# Patient Record
Sex: Male | Born: 1960 | Race: Black or African American | Hispanic: No | Marital: Married | Smoking: Never smoker
Health system: Southern US, Community
[De-identification: ages and names within clinical notes are randomized; demographics above are authoritative.]

## PROBLEM LIST (undated history)

## (undated) DIAGNOSIS — K5780 Diverticulitis of intestine, part unspecified, with perforation and abscess without bleeding: Secondary | ICD-10-CM

---

## 2008-01-07 HISTORY — PX: INCISION AND DRAINAGE ABSCESS: SHX5864

## 2008-07-12 ENCOUNTER — Ambulatory Visit (HOSPITAL_COMMUNITY): Admission: RE | Admit: 2008-07-12 | Discharge: 2008-07-12 | Payer: Self-pay | Admitting: General Surgery

## 2010-04-14 LAB — CULTURE, ROUTINE-ABSCESS

## 2010-04-14 LAB — ANAEROBIC CULTURE

## 2013-04-27 ENCOUNTER — Inpatient Hospital Stay (HOSPITAL_COMMUNITY)
Admission: AD | Admit: 2013-04-27 | Discharge: 2013-05-01 | DRG: 392 | Disposition: A | Discharge status: Home / Self Care | Payer: BC Managed Care – PPO | Source: Other Acute Inpatient Hospital | Attending: Internal Medicine | Admitting: Internal Medicine

## 2013-04-27 ENCOUNTER — Encounter (HOSPITAL_COMMUNITY): Payer: Self-pay | Admitting: Internal Medicine

## 2013-04-27 DIAGNOSIS — K5720 Diverticulitis of large intestine with perforation and abscess without bleeding: Secondary | ICD-10-CM | POA: Diagnosis present

## 2013-04-27 DIAGNOSIS — R110 Nausea: Secondary | ICD-10-CM | POA: Diagnosis not present

## 2013-04-27 DIAGNOSIS — K5732 Diverticulitis of large intestine without perforation or abscess without bleeding: Principal | ICD-10-CM

## 2013-04-27 DIAGNOSIS — K630 Abscess of intestine: Secondary | ICD-10-CM | POA: Diagnosis present

## 2013-04-27 DIAGNOSIS — D72829 Elevated white blood cell count, unspecified: Secondary | ICD-10-CM

## 2013-04-27 HISTORY — DX: Diverticulitis of intestine, part unspecified, with perforation and abscess without bleeding: K57.80

## 2013-04-27 LAB — CBC WITH DIFFERENTIAL/PLATELET
BASOS ABS: 0 10*3/uL (ref 0.0–0.1)
BASOS PCT: 0 % (ref 0–1)
EOS ABS: 0.1 10*3/uL (ref 0.0–0.7)
EOS PCT: 1 % (ref 0–5)
HEMATOCRIT: 41.2 % (ref 39.0–52.0)
Hemoglobin: 14.3 g/dL (ref 13.0–17.0)
LYMPHS PCT: 22 % (ref 12–46)
Lymphs Abs: 2 10*3/uL (ref 0.7–4.0)
MCH: 30.9 pg (ref 26.0–34.0)
MCHC: 34.7 g/dL (ref 30.0–36.0)
MCV: 89 fL (ref 78.0–100.0)
MONO ABS: 1.2 10*3/uL — AB (ref 0.1–1.0)
Monocytes Relative: 14 % — ABNORMAL HIGH (ref 3–12)
Neutro Abs: 5.8 10*3/uL (ref 1.7–7.7)
Neutrophils Relative %: 63 % (ref 43–77)
Platelets: 382 10*3/uL (ref 150–400)
RBC: 4.63 MIL/uL (ref 4.22–5.81)
RDW: 13.2 % (ref 11.5–15.5)
WBC: 9.1 10*3/uL (ref 4.0–10.5)

## 2013-04-27 LAB — PROTIME-INR
INR: 1.11 (ref 0.00–1.49)
PROTHROMBIN TIME: 14.1 s (ref 11.6–15.2)

## 2013-04-27 LAB — BASIC METABOLIC PANEL
BUN: 13 mg/dL (ref 6–23)
CALCIUM: 9.5 mg/dL (ref 8.4–10.5)
CO2: 25 meq/L (ref 19–32)
CREATININE: 1.21 mg/dL (ref 0.50–1.35)
Chloride: 98 mEq/L (ref 96–112)
GFR calc Af Amer: 77 mL/min — ABNORMAL LOW (ref >90)
GFR, EST NON AFRICAN AMERICAN: 67 mL/min — AB (ref >90)
Glucose, Bld: 130 mg/dL — ABNORMAL HIGH (ref 70–99)
Potassium: 4.3 mEq/L (ref 3.7–5.3)
Sodium: 137 mEq/L (ref 137–147)

## 2013-04-27 MED ORDER — HYDROMORPHONE HCL PF 1 MG/ML IJ SOLN
1.0000 mg | INTRAMUSCULAR | Status: DC | PRN
  Administered 2013-04-27 – 2013-04-28 (×6): 1 mg via INTRAVENOUS
  Filled 2013-04-27 (×6): qty 1

## 2013-04-27 MED ORDER — ACETAMINOPHEN 650 MG RE SUPP: 650.0000 mg | Freq: Four times a day (QID) | RECTAL | Status: DC | PRN

## 2013-04-27 MED ORDER — SODIUM CHLORIDE 0.9 % IV SOLN
3.0000 g | Freq: Four times a day (QID) | INTRAVENOUS | Status: DC
  Administered 2013-04-27 – 2013-04-29 (×8): 3 g via INTRAVENOUS
  Filled 2013-04-27 (×11): qty 3

## 2013-04-27 MED ORDER — ACETAMINOPHEN 325 MG PO TABS: 650.0000 mg | ORAL_TABLET | Freq: Four times a day (QID) | ORAL | Status: DC | PRN

## 2013-04-27 MED ORDER — ALUM & MAG HYDROXIDE-SIMETH 200-200-20 MG/5ML PO SUSP: 30.0000 mL | Freq: Four times a day (QID) | ORAL | Status: DC | PRN

## 2013-04-27 MED ORDER — ONDANSETRON HCL 4 MG PO TABS
4.0000 mg | ORAL_TABLET | Freq: Four times a day (QID) | ORAL | Status: DC | PRN
  Administered 2013-04-29: 4 mg via ORAL
  Filled 2013-04-27: qty 1

## 2013-04-27 MED ORDER — SODIUM CHLORIDE 0.9 % IV SOLN
INTRAVENOUS | Status: DC
  Administered 2013-04-27: 21:00:00 via INTRAVENOUS
  Administered 2013-04-28: 100 mL/h via INTRAVENOUS
  Administered 2013-04-28: 10:00:00 via INTRAVENOUS

## 2013-04-27 MED ORDER — ONDANSETRON HCL 4 MG/2ML IJ SOLN: 4.0000 mg | Freq: Four times a day (QID) | INTRAMUSCULAR | Status: DC | PRN

## 2013-04-27 MED ORDER — OXYCODONE HCL 5 MG PO TABS
5.0000 mg | ORAL_TABLET | ORAL | Status: DC | PRN
  Administered 2013-04-28 – 2013-05-01 (×8): 5 mg via ORAL
  Filled 2013-04-27 (×8): qty 1

## 2013-04-27 NOTE — Progress Notes (Signed)
[facility] History and Physical  Bobby StackWilliam [name] WUJ:[id number] DOB: 02-20-1960 DOA: 04/27/2013  Referring physician: Sherryll [name] PCP: Bobby [name]   Chief Complaint: abdominal pain  HPI: Bobby [name] is a 53 y.o. male  With [facility] to [facility] last week for diverticulitis. Presented to EDP today with continued pain, fevers, occasional nausea. [place] showed [place].  Directly admitted for IV antibiotics, and consideration of percutanious drainage.  Pt able to eat and drink, but pain unrelieved by vicodin. Still on cipro/flagyl.  Also had diverticular abscess in 2010 requiring percutaneous drainage.  Had colonoscopy within the past 5 years in [place]. Reportedly ok.  Review of Systems:  Charts reviewed. As above, otherwise negative  Past Medical History  Diagnosis Date  . Diverticulitis of intestine with abscess    No past surgical history on file. Social History:  has no tobacco, alcohol, and drug history on file.works as truckdriver  Allergies not on file  Family History  Problem Relation Age of Onset  . Diabetes Mother   . Hypertension Mother      Prior to Admission medications   Not on File   Physical Exam: Filed Vitals:   04/27/13 1754  BP: 96/63  Pulse: 97  Temp: 98.2 F (36.8 C)  Resp: 16    BP 96/63  Pulse 97  Temp(Src) 98.2 F (36.8 C) (Oral)  Resp 16  Ht 5\' 6"  (1.676 m)  Wt 79.833 kg (176 lb)  BMI 28.42 kg/m2  SpO2 98%       BP 94/60  Pulse 74  Temp(Src) 97.8 F (36.6 C) (Oral)  Resp 16  Ht 5\' 6"  (1.676 m)  Wt 79.833 kg (176 lb)  BMI 28.42 kg/m2  SpO2 99%  General Appearance:    Alert, cooperative, no distress, appears stated age, nontoxic  Head:    Normocephalic, without obvious abnormality, atraumatic  Eyes:    PERRL, conjunctiva/corneas clear, EOM's intact, fundi    benign, both eyes          Nose:   Nares normal, septum midline, mucosa normal, no drainage   or sinus tenderness    Throat:   Lips, mucosa, and tongue normal; teeth and gums normal  Neck:   Supple, symmetrical, trachea midline, no adenopathy;       thyroid:  No enlargement/tenderness/nodules; no carotid   bruit or JVD  Back:     Symmetric, no curvature, ROM normal, no CVA tenderness  Lungs:     Clear to auscultation bilaterally, respirations unlabored  Chest wall:    No tenderness or deformity  Heart:    Regular rate and rhythm, S1 and S2 normal, no murmur, rub   or gallop  Abdomen:     Soft, mild lower abd tenderness, nondistended. Bowel sounds present  Genitalia:  deferred  Rectal:  deferred  Extremities:   Extremities normal, atraumatic, no cyanosis or edema  Pulses:   2+ and symmetric all extremities  Skin:   Skin color, texture, turgor normal, no rashes or lesions  Lymph nodes:   Cervical, supraclavicular, and axillary nodes normal  Neurologic:   CNII-XII intact. Normal strength, sensation and reflexes      throughout   Labs pending  Assessment/Plan Active Problems:   Diverticulitis of large intestine with abscess without bleeding discussed with Dr. Bonnielee HaffHoss, IR.  Amenable to perc drainage. Will start clears, unasyn. No heparin products before procedure. Pain and nausea meds. F/u CBC, BMET.    Code Status: *full Family Communication: *at  bedside Disposition Plan: home  Time spent: 45 minutes  Christiane Haorinna L Cutter [name] [facility] Pager [phone]

## 2013-04-28 ENCOUNTER — Inpatient Hospital Stay (HOSPITAL_COMMUNITY): Payer: BC Managed Care – PPO

## 2013-04-28 ENCOUNTER — Encounter (HOSPITAL_COMMUNITY): Payer: Self-pay | Admitting: Radiology

## 2013-04-28 MED ORDER — HYDROMORPHONE HCL PF 1 MG/ML IJ SOLN
1.0000 mg | INTRAMUSCULAR | Status: DC | PRN
  Administered 2013-04-28: 1 mg via INTRAVENOUS
  Administered 2013-04-28 – 2013-04-29 (×2): 2 mg via INTRAVENOUS
  Administered 2013-04-29: 1 mg via INTRAVENOUS
  Administered 2013-04-29: 2 mg via INTRAVENOUS
  Filled 2013-04-28: qty 1
  Filled 2013-04-28 (×3): qty 2
  Filled 2013-04-28: qty 1

## 2013-04-28 MED ORDER — MIDAZOLAM HCL 2 MG/2ML IJ SOLN
INTRAMUSCULAR | Status: AC
  Filled 2013-04-28: qty 6

## 2013-04-28 MED ORDER — LIDOCAINE HCL 1 % IJ SOLN
INTRAMUSCULAR | Status: AC
  Filled 2013-04-28: qty 10

## 2013-04-28 MED ORDER — FENTANYL CITRATE 0.05 MG/ML IJ SOLN
INTRAMUSCULAR | Status: AC | PRN
  Administered 2013-04-28 (×3): 50 ug via INTRAVENOUS

## 2013-04-28 MED ORDER — HYDROCODONE-ACETAMINOPHEN 5-325 MG PO TABS
1.0000 | ORAL_TABLET | ORAL | Status: DC | PRN
  Administered 2013-04-28 – 2013-04-30 (×4): 2 via ORAL
  Filled 2013-04-28 (×4): qty 2

## 2013-04-28 MED ORDER — FENTANYL CITRATE 0.05 MG/ML IJ SOLN
INTRAMUSCULAR | Status: AC
  Filled 2013-04-28: qty 4

## 2013-04-28 MED ORDER — MIDAZOLAM HCL 2 MG/2ML IJ SOLN
INTRAMUSCULAR | Status: AC | PRN
  Administered 2013-04-28: 1 mg via INTRAVENOUS
  Administered 2013-04-28 (×2): 2 mg via INTRAVENOUS

## 2013-04-28 NOTE — Progress Notes (Signed)
  PROGRESS NOTE  Bobby StackWilliam [name] SWF:[id number] DOB: Sep 05, 1960 DOA: 04/27/2013 PCP: Bobby [name]  Assessment/Plan: Diverticulitis of large intestine with abscess without bleeding  UK:GURKR:perc drainage.  -unasyn. -advance diet after procedure -Pain and nausea meds. -F/u CBC, BMET   Code Status: full Family Communication: family at bedside Disposition Plan: home 1-2 days   Consultants:  IR  Procedures:     HPI/Subjective:  abd pain about the same Asking to eat  Objective: Filed Vitals:   04/28/13 1003  BP: 96/68  Pulse: 78  Temp: 98.4 F (36.9 C)  Resp: 16    Intake/Output Summary (Last 24 hours) at 04/28/13 1007 Last data filed at 04/28/13 1006  Gross per 24 hour  Intake    240 ml  Output    500 ml  Net   -260 ml   Filed Weights   04/27/13 1754  Weight: 79.833 kg (176 lb)    Exam:   General:  A+Ox3, NAD- up walking in room  Cardiovascular: rrr  Respiratory: clear anterior  Abdomen: +BS, soft, min LLQ tenderness  Musculoskeletal: no edema   Data Reviewed: Basic Metabolic Panel:  Recent Labs Lab 04/27/13 1845  NA 137  K 4.3  CL 98  CO2 25  GLUCOSE 130*  BUN 13  CREATININE 1.21  CALCIUM 9.5   Liver Function Tests: No results found for this basename: AST, ALT, ALKPHOS, BILITOT, PROT, ALBUMIN,  in the last 168 hours No results found for this basename: LIPASE, AMYLASE,  in the last 168 hours No results found for this basename: AMMONIA,  in the last 168 hours CBC:  Recent Labs Lab 04/27/13 1845  WBC 9.1  NEUTROABS 5.8  HGB 14.3  HCT 41.2  MCV 89.0  PLT 382   Cardiac Enzymes: No results found for this basename: CKTOTAL, CKMB, CKMBINDEX, TROPONINI,  in the last 168 hours BNP (last 3 results) No results found for this basename: PROBNP,  in the last 8760 hours CBG: No results found for this basename: GLUCAP,  in the last 168 hours  No results found for this or any previous visit (from the past 240 hour(s)).   Studies: No  results found.  Scheduled Meds: . ampicillin-sulbactam (UNASYN) IV  3 g Intravenous Q6H   Continuous Infusions: . sodium chloride 100 mL/hr at 04/27/13 2055   Antibiotics Given (last 72 hours)   Date/Time Action Medication Dose Rate   04/27/13 2056 Given   Ampicillin-Sulbactam (UNASYN) 3 g in sodium chloride 0.9 % 100 mL IVPB 3 g 100 mL/hr   04/28/13 0150 Given   Ampicillin-Sulbactam (UNASYN) 3 g in sodium chloride 0.9 % 100 mL IVPB 3 g 100 mL/hr   04/28/13 0657 Given   Ampicillin-Sulbactam (UNASYN) 3 g in sodium chloride 0.9 % 100 mL IVPB 3 g 100 mL/hr      Active Problems:   Diverticulitis of large intestine with abscess without bleeding    Time spent: 35 min    Bobby [name]  [facility] Pager [phone]. If 7PM-7AM, please contact night-coverage at [email or web address], password [email or web address] 04/28/2013, 10:07 AM  LOS: 1 day

## 2013-04-28 NOTE — Procedures (Signed)
CT guided pelvic 49F abscess drain catheter placement 20ml purulent out, sample for GS, C&S No complication No blood loss. See complete dictation in [facility].

## 2013-04-28 NOTE — H&P (Signed)
Chief Complaint: "Abdominal pain." Referring Physician: Dr. Conley Canal HPI: Bobby [name] is an 53 y.o. male who presented complaining of LLQ abdominal pain x 1 week, fever and chills. He had a CT in [place] which revealed diverticulitis perforation with abscess, he was placed on oral antibiotics and sent home. He presented back to the hospital since pain was worsening, repeat CT revealed increasing abscess. He has been placed on IV antibiotics and IR received request for image guided diverticular abscess drainage. He rates his abdominal LLQ pain 8/10, however pain is currently controlled with pain medications. He denies any chest pain, shortness of breath or palpitations. He denies any active signs of bleeding or excessive bruising. The patient denies any history of sleep apnea or chronic oxygen use. He has previously tolerated sedation without complications during a colonoscopy.   Past Medical History:  Past Medical History  Diagnosis Date  . Diverticulitis of intestine with abscess     Past Surgical History:  Past Surgical History  Procedure Laterality Date  . Incision and drainage abscess  2010    "diverticulitis"    Family History:  Family History  Problem Relation Age of Onset  . Diabetes Mother   . Hypertension Mother     Social History:  reports that he has never smoked. He has never used smokeless tobacco. He reports that he does not drink alcohol or use illicit drugs.  Allergies: Not on File  Medications:   Medication List    ASK your doctor about these medications       ciprofloxacin 500 MG tablet  Commonly known as:  CIPRO  Take 500 mg by mouth 2 (two) times daily. For 10 days     HYDROcodone-acetaminophen 5-325 MG per tablet  Commonly known as:  NORCO/VICODIN  Take 1 tablet by mouth every 6 (six) hours as needed for moderate pain.     metroNIDAZOLE 500 MG tablet  Commonly known as:  FLAGYL  Take 500 mg by mouth 3 (three) times daily. For 10 days       Please  HPI for pertinent positives, otherwise complete 10 system ROS negative.  Physical Exam: BP 99/59  Pulse 79  Temp(Src) 99.6 F (37.6 C) (Oral)  Resp 17  Ht 5' 6"  (1.676 m)  Wt 176 lb (79.833 kg)  BMI 28.42 kg/m2  SpO2 99% Body mass index is 28.42 kg/(m^2).  General Appearance:  Alert, cooperative, no distress  Head:  Normocephalic, without obvious abnormality, atraumatic  Neck: Supple, symmetrical, trachea midline  Lungs:   Clear to auscultation bilaterally, no w/r/r, respirations unlabored without use of accessory muscles.  Chest Wall:  No tenderness or deformity  Heart:  Regular rate and rhythm, S1, S2 normal, no murmur, rub or gallop.  Abdomen:   Soft, LLQ tenderness, non distended, (+) BS  Extremities: Extremities normal, atraumatic, no cyanosis or edema  Pulses: 2+ and symmetric  Neurologic: Normal affect, no gross deficits.   Results for orders placed during the hospital encounter of 04/27/13 (from the past 48 hour(s))  PROTIME-INR     Status: None   Collection Time    04/27/13  6:44 PM      Result Value Ref Range   Prothrombin Time 14.1  11.6 - 15.2 seconds   INR 1.11  0.00 - 1.49  CBC WITH DIFFERENTIAL     Status: Abnormal   Collection Time    04/27/13  6:45 PM      Result Value Ref Range   WBC 9.1  4.0 -  10.5 K/uL   RBC 4.63  4.22 - 5.81 MIL/uL   Hemoglobin 14.3  13.0 - 17.0 g/dL   HCT 41.2  39.0 - 52.0 %   MCV 89.0  78.0 - 100.0 fL   MCH 30.9  26.0 - 34.0 pg   MCHC 34.7  30.0 - 36.0 g/dL   RDW 13.2  11.5 - 15.5 %   Platelets 382  150 - 400 K/uL   Neutrophils Relative % 63  43 - 77 %   Neutro Abs 5.8  1.7 - 7.7 K/uL   Lymphocytes Relative 22  12 - 46 %   Lymphs Abs 2.0  0.7 - 4.0 K/uL   Monocytes Relative 14 (*) 3 - 12 %   Monocytes Absolute 1.2 (*) 0.1 - 1.0 K/uL   Eosinophils Relative 1  0 - 5 %   Eosinophils Absolute 0.1  0.0 - 0.7 K/uL   Basophils Relative 0  0 - 1 %   Basophils Absolute 0.0  0.0 - 0.1 K/uL  BASIC METABOLIC PANEL     Status: Abnormal    Collection Time    04/27/13  6:45 PM      Result Value Ref Range   Sodium 137  137 - 147 mEq/L   Potassium 4.3  3.7 - 5.3 mEq/L   Chloride 98  96 - 112 mEq/L   CO2 25  19 - 32 mEq/L   Glucose, Bld 130 (*) 70 - 99 mg/dL   BUN 13  6 - 23 mg/dL   Creatinine, Ser 1.21  0.50 - 1.35 mg/dL   Calcium 9.5  8.4 - 10.5 mg/dL   GFR calc non Af Amer 67 (*) >90 mL/min   GFR calc Af Amer 77 (*) >90 mL/min   Comment: (NOTE)     The eGFR has been calculated using the CKD EPI equation.     This calculation has not been validated in all clinical situations.     eGFR's persistently <90 mL/min signify possible Chronic Kidney     Disease.   No results found.  Assessment/Plan Abdominal pain.  Fever. Diverticulitis with perforation and abscess Request for image guided diverticular abscess drainage Patient has been NPO, on IV antibiotics, labs and images reviewed. Risks and Benefits discussed with the patient. All of the patient's questions were answered, patient is agreeable to proceed. Consent signed and in chart.   Hedy Jacob PA-C 04/28/2013, 8:52 AM

## 2013-04-29 DIAGNOSIS — K5732 Diverticulitis of large intestine without perforation or abscess without bleeding: Secondary | ICD-10-CM

## 2013-04-29 DIAGNOSIS — D72829 Elevated white blood cell count, unspecified: Secondary | ICD-10-CM

## 2013-04-29 LAB — CBC
HCT: 38.8 % — ABNORMAL LOW (ref 39.0–52.0)
Hemoglobin: 12.7 g/dL — ABNORMAL LOW (ref 13.0–17.0)
MCH: 29.7 pg (ref 26.0–34.0)
MCHC: 32.7 g/dL (ref 30.0–36.0)
MCV: 90.7 fL (ref 78.0–100.0)
PLATELETS: 375 10*3/uL (ref 150–400)
RBC: 4.28 MIL/uL (ref 4.22–5.81)
RDW: 13.4 % (ref 11.5–15.5)
WBC: 11.4 10*3/uL — AB (ref 4.0–10.5)

## 2013-04-29 LAB — BASIC METABOLIC PANEL
BUN: 10 mg/dL (ref 6–23)
CHLORIDE: 98 meq/L (ref 96–112)
CO2: 23 mEq/L (ref 19–32)
Calcium: 8.8 mg/dL (ref 8.4–10.5)
Creatinine, Ser: 1.19 mg/dL (ref 0.50–1.35)
GFR calc Af Amer: 79 mL/min — ABNORMAL LOW (ref >90)
GFR calc non Af Amer: 68 mL/min — ABNORMAL LOW (ref >90)
Glucose, Bld: 119 mg/dL — ABNORMAL HIGH (ref 70–99)
POTASSIUM: 4.1 meq/L (ref 3.7–5.3)
Sodium: 135 mEq/L — ABNORMAL LOW (ref 137–147)

## 2013-04-29 MED ORDER — PIPERACILLIN-TAZOBACTAM 3.375 G IVPB
3.3750 g | Freq: Three times a day (TID) | INTRAVENOUS | Status: DC
  Administered 2013-04-29 – 2013-05-01 (×6): 3.375 g via INTRAVENOUS
  Filled 2013-04-29 (×10): qty 50

## 2013-04-29 MED ORDER — POLYETHYLENE GLYCOL 3350 17 G PO PACK
17.0000 g | PACK | Freq: Every day | ORAL | Status: DC
  Administered 2013-04-29 – 2013-05-01 (×3): 17 g via ORAL
  Filled 2013-04-29 (×3): qty 1

## 2013-04-29 NOTE — Progress Notes (Signed)
ANTIBIOTIC CONSULT NOTE - INITIAL  Pharmacy Consult for Zosyn Indication: diverticulitis with intra-ab abcess  No Known Allergies  Patient Measurements: Height: 5\' 6"  (167.6 cm) Weight: 176 lb (79.833 kg) IBW/kg (Calculated) : 63.8   Vital Signs: Temp: 98 F (36.7 C) (04/24 1417) Temp src: Oral (04/24 1417) BP: 102/69 mmHg (04/24 1417) Pulse Rate: 71 (04/24 1417) Intake/Output from previous day: 04/23 0701 - 04/24 0700 In: 1888.3 [P.O.:240; I.V.:1648.3] Out: 1965 [Urine:1925; Drains:40] Intake/Output from this shift: Total I/O In: 360 [P.O.:360] Out: -   Labs:  Recent Labs  04/27/13 1845 04/29/13 0339  WBC 9.1 11.4*  HGB 14.3 12.7*  PLT 382 375  CREATININE 1.21 1.19   Estimated Creatinine Clearance: 71.3 ml/min (by C-G formula based on Cr of 1.19). No results found for this basename: VANCOTROUGH, Leodis BinetVANCOPEAK, VANCORANDOM, GENTTROUGH, GENTPEAK, GENTRANDOM, TOBRATROUGH, TOBRAPEAK, TOBRARND, AMIKACINPEAK, AMIKACINTROU, AMIKACIN,  in the last 72 hours   Microbiology: Recent Results (from the past 720 hour(s))  CULTURE, ROUTINE-ABSCESS     Status: None   Collection Time    04/28/13  1:45 PM      Result Value Ref Range Status   Specimen Description ABSCESS LEFT ABDOMEN   Final   Special Requests Normal   Final   Gram Stain     Final   Value: ABUNDANT WBC PRESENT,BOTH PMN AND MONONUCLEAR     NO SQUAMOUS EPITHELIAL CELLS SEEN     MODERATE GRAM POSITIVE COCCI     IN PAIRS FEW GRAM POSITIVE RODS     FEW GRAM NEGATIVE RODS   Culture     Final   Value: Culture reincubated for better growth     Performed at [facility]   Report Status PENDING   Incomplete    Medical History: Past Medical History  Diagnosis Date  . Diverticulitis of intestine with abscess      Assessment: 52yom admitted with LLQ pain found to have diverticulitis and intra-abdominal abcess.  WBC 11, Cr 1.2 Crcl 7670ml/min.  He was originally started on Unasyn but changing to Zosyn to  broaden coverage.   Since renal function stable - Pharmacy to sign off.     Plan:  Zosyn 3.375 GM IV q8hr EI  Leota SauersLisa Deaisa Merida Pharm.D. CPP, BCPS Clinical Pharmacist [phone] 04/29/2013 3:44 PM

## 2013-04-29 NOTE — Progress Notes (Signed)
Subjective: He states his abdominal pain has improved but still with soreness now around drain site. He admits to start of nausea today.  Objective: Physical Exam: BP 102/69  Pulse 71  Temp(Src) 98 F (36.7 C) (Oral)  Resp 18  Ht 5\' 6"  (1.676 m)  Wt 176 lb (79.833 kg)  BMI 28.42 kg/m2  SpO2 96%  General: A&Ox3, NAD Abd: Soft, LLQ drain intact bloody thick output, tenderness at site, Cx MODERATE GRAM POSITIVE COCCI IN PAIRS FEW GRAM POSITIVE RODS FEW GRAM NEGATIVE RODS  Labs: CBC  Recent Labs  04/27/13 1845 04/29/13 0339  WBC 9.1 11.4*  HGB 14.3 12.7*  HCT 41.2 38.8*  PLT 382 375   BMET  Recent Labs  04/27/13 1845 04/29/13 0339  NA 137 135*  K 4.3 4.1  CL 98 98  CO2 25 23  GLUCOSE 130* 119*  BUN 13 10  CREATININE 1.21 1.19  CALCIUM 9.5 8.8   LFT No results found for this basename: PROT, ALBUMIN, AST, ALT, ALKPHOS, BILITOT, BILIDIR, IBILI, LIPASE,  in the last 72 hours PT/INR  Recent Labs  04/27/13 1844  LABPROT 14.1  INR 1.11     Studies/Results: Ct Image Guided Drainage By Percutaneous Catheter  04/28/2013   CLINICAL DATA:  Recurrent diverticular abscess.  EXAM: CT GUIDED DRAINAGE OF PELVIC ABSCESS  ANESTHESIA/SEDATION: Intravenous Fentanyl and Versed were administered as conscious sedation during continuous cardiorespiratory monitoring by the radiology RN, with a total moderate sedation time of 10 minutes.  PROCEDURE: The procedure, risks, benefits, and alternatives were explained to the patient. Questions regarding the procedure were encouraged and answered. The patient understands and consents to the procedure.  Select axial scans through the lower pelvis were obtained an the dominant collection was localized. An appropriate skin entry site was determined.  The overlying skin was prepped with Betadinein a sterile fashion, and a sterile drape was applied covering the operative field. A sterile gown and sterile gloves were used for the procedure. Local  anesthesia was provided with 1% Lidocaine.  Under CT fluoroscopic guidance, a 19 gauge percutaneous entry needle was advanced into the collection. Purulent fluid spontaneously returned through the needle hub. A guidewire advanced easily through the needle, its position confirmed on CT. The tract was dilated to facilitate placement of 12 French pigtail catheter, formed centrally within the collection. Catheter placement confirmed on CT. The catheter was secured externally with 0 Prolene suture and StatLock and placed to gravity bag. A total of approximately 20 mL purulent material were aspirated. A sample was sent for routine Gram stain, culture and sensitivity. The patient tolerated the procedure well.  COMPLICATIONS: No immediate  FINDINGS: The loculated pelvic gas and fluid collection immediately cephalad to the urinary bladder was again identified. Percutaneous drain catheter placed under CT guidance.  IMPRESSION: 1. Technically successful CT-guided pelvic abscess drain catheter placement.   Electronically Signed   By: Oley Balmaniel  Hassell M.D.   On: 04/28/2013 14:08    Assessment/Plan: Diverticulitis with perforation and abscess S/p 76F perc drain placed 04/28/13, output bloody purulent, wbc increased, afebrile, on Zosyn Appreciate TRH and CCS input.  IR will follow.    LOS: 2 days    Berneta LevinsKoreen D Eligh Rybacki PA-C 04/29/2013 3:24 PM

## 2013-04-29 NOTE — Progress Notes (Addendum)
PROGRESS NOTE  Bobbie StackWilliam Mau BJY:[id number] DOB: 17-Oct-1960 DOA: 04/27/2013 PCP: Kirstie PeriSHAH,ASHISH, MD  Assessment/Plan: Diverticulitis of large intestine with abscess without bleeding  YQ:MVHQR:perc drainage. Plan per IR -unasyn. -Pain and nausea meds.- trying to just use PO pain meds -ask surgery to consult  Leukocytosis- -monitor   Code Status: full Family Communication: family at bedside Disposition Plan: home 1-2 days   Consultants:  IR  Procedures:     HPI/Subjective:  No complaints   Objective: Filed Vitals:   04/29/13 1023  BP: 105/69  Pulse: 88  Temp: 98.1 F (36.7 C)  Resp: 18    Intake/Output Summary (Last 24 hours) at 04/29/13 1037 Last data filed at 04/29/13 [id number]  Gross per 24 hour  Intake 1888.33 ml  Output   1790 ml  Net  98.33 ml   Filed Weights   04/27/13 1754  Weight: 79.833 kg (176 lb)    Exam:   General:  A+Ox3, NAD- up walking in room  Cardiovascular: rrr  Respiratory: clear anterior  Abdomen: +BS, soft, min LLQ tenderness  Musculoskeletal: no edema   Data Reviewed: Basic Metabolic Panel:  Recent Labs Lab 04/27/13 1845 04/29/13 0339  NA 137 135*  K 4.3 4.1  CL 98 98  CO2 25 23  GLUCOSE 130* 119*  BUN 13 10  CREATININE 1.21 1.19  CALCIUM 9.5 8.8   Liver Function Tests: No results found for this basename: AST, ALT, ALKPHOS, BILITOT, PROT, ALBUMIN,  in the last 168 hours No results found for this basename: LIPASE, AMYLASE,  in the last 168 hours No results found for this basename: AMMONIA,  in the last 168 hours CBC:  Recent Labs Lab 04/27/13 1845 04/29/13 0339  WBC 9.1 11.4*  NEUTROABS 5.8  --   HGB 14.3 12.7*  HCT 41.2 38.8*  MCV 89.0 90.7  PLT 382 375   Cardiac Enzymes: No results found for this basename: CKTOTAL, CKMB, CKMBINDEX, TROPONINI,  in the last 168 hours BNP (last 3 results) No results found for this basename: PROBNP,  in the last 8760 hours CBG: No results found for this basename: GLUCAP,   in the last 168 hours  Recent Results (from the past 240 hour(s))  CULTURE, ROUTINE-ABSCESS     Status: None   Collection Time    04/28/13  1:45 PM      Result Value Ref Range Status   Specimen Description ABSCESS LEFT ABDOMEN   Final   Special Requests Normal   Final   Gram Stain     Final   Value: ABUNDANT WBC PRESENT,BOTH PMN AND MONONUCLEAR     NO SQUAMOUS EPITHELIAL CELLS SEEN     MODERATE GRAM POSITIVE COCCI     IN PAIRS FEW GRAM POSITIVE RODS     FEW GRAM NEGATIVE RODS   Culture     Final   Value: Culture reincubated for better growth     Performed at [facility]   Report Status PENDING   Incomplete     Studies: Ct Image Guided Drainage By Percutaneous Catheter  04/28/2013   CLINICAL DATA:  Recurrent diverticular abscess.  EXAM: CT GUIDED DRAINAGE OF PELVIC ABSCESS  ANESTHESIA/SEDATION: Intravenous Fentanyl and Versed were administered as conscious sedation during continuous cardiorespiratory monitoring by the radiology RN, with a total moderate sedation time of 10 minutes.  PROCEDURE: The procedure, risks, benefits, and alternatives were explained to the patient. Questions regarding the procedure were encouraged and answered. The patient understands and consents to the procedure.  Select  axial scans through the lower pelvis were obtained an the dominant collection was localized. An appropriate skin entry site was determined.  The overlying skin was prepped with Betadinein a sterile fashion, and a sterile drape was applied covering the operative field. A sterile gown and sterile gloves were used for the procedure. Local anesthesia was provided with 1% Lidocaine.  Under CT fluoroscopic guidance, a 19 gauge percutaneous entry needle was advanced into the collection. Purulent fluid spontaneously returned through the needle hub. A guidewire advanced easily through the needle, its position confirmed on CT. The tract was dilated to facilitate placement of 12 French pigtail catheter,  formed centrally within the collection. Catheter placement confirmed on CT. The catheter was secured externally with 0 Prolene suture and StatLock and placed to gravity bag. A total of approximately 20 mL purulent material were aspirated. A sample was sent for routine Gram stain, culture and sensitivity. The patient tolerated the procedure well.  COMPLICATIONS: No immediate  FINDINGS: The loculated pelvic gas and fluid collection immediately cephalad to the urinary bladder was again identified. Percutaneous drain catheter placed under CT guidance.  IMPRESSION: 1. Technically successful CT-guided pelvic abscess drain catheter placement.   Electronically Signed   By: Oley Balmaniel  Hassell M.D.   On: 04/28/2013 14:08    Scheduled Meds: . ampicillin-sulbactam (UNASYN) IV  3 g Intravenous Q6H   Continuous Infusions: . sodium chloride 100 mL/hr (04/28/13 2129)   Antibiotics Given (last 72 hours)   Date/Time Action Medication Dose Rate   04/27/13 2056 Given   Ampicillin-Sulbactam (UNASYN) 3 g in sodium chloride 0.9 % 100 mL IVPB 3 g 100 mL/hr   04/28/13 0150 Given   Ampicillin-Sulbactam (UNASYN) 3 g in sodium chloride 0.9 % 100 mL IVPB 3 g 100 mL/hr   04/28/13 0657 Given   Ampicillin-Sulbactam (UNASYN) 3 g in sodium chloride 0.9 % 100 mL IVPB 3 g 100 mL/hr   04/28/13 1211 Given   Ampicillin-Sulbactam (UNASYN) 3 g in sodium chloride 0.9 % 100 mL IVPB 3 g 100 mL/hr   04/28/13 1807 Given   Ampicillin-Sulbactam (UNASYN) 3 g in sodium chloride 0.9 % 100 mL IVPB 3 g 100 mL/hr   04/29/13 0151 Given   Ampicillin-Sulbactam (UNASYN) 3 g in sodium chloride 0.9 % 100 mL IVPB 3 g 100 mL/hr   04/29/13 [id number] Given   Ampicillin-Sulbactam (UNASYN) 3 g in sodium chloride 0.9 % 100 mL IVPB 3 g 100 mL/hr      Active Problems:   Diverticulitis of large intestine with abscess without bleeding    Time spent: 35 min    Joseph ArtJessica U Marine Lezotte  [facility] Pager [phone]. If 7PM-7AM, please contact night-coverage at  [email or web address], password [email or web address] 04/29/2013, 10:37 AM  LOS: 2 days

## 2013-04-29 NOTE — Consult Note (Signed)
I have seen and examined the patient and agree with the assessment and plans. May need to switch to Zosyn for better coverage of E.coli  Bobby Duris A. Magnus IvanBlackman  MD, FACS

## 2013-04-29 NOTE — Consult Note (Signed)
Reason for Consult:Diverticulitis Referring Physician: Perri [name] is an 53 y.o. male.  HPI: Bobby [name] had an approximately week long history of LLQ abd pain that was quite severe. He sought care in the ED and was diagnosed with diverticulitis with abscess formation. He was transferred from [facility] and had a perc drain placed by IR. Surgery was asked to consult. He states he feels about the same with significant abdominal soreness. He had one other episode of this in 2010 that required drain placement and a short hospital stay. He did not see a Psychologist, sport and exercise at that time.  Past Medical History  Diagnosis Date  . Diverticulitis of intestine with abscess     Past Surgical History  Procedure Laterality Date  . Incision and drainage abscess  2010    "diverticulitis"    Family History  Problem Relation Age of Onset  . Diabetes Mother   . Hypertension Mother     Social History:  reports that he has never smoked. He has never used smokeless tobacco. He reports that he does not drink alcohol or use illicit drugs.  Allergies: No Known Allergies  Medications: I have reviewed the patient's current medications.  Results for orders placed during the hospital encounter of 04/27/13 (from the past 48 hour(s))  PROTIME-INR     Status: None   Collection Time    04/27/13  6:44 PM      Result Value Ref Range   Prothrombin Time 14.1  11.6 - 15.2 seconds   INR 1.11  0.00 - 1.49  CBC WITH DIFFERENTIAL     Status: Abnormal   Collection Time    04/27/13  6:45 PM      Result Value Ref Range   WBC 9.1  4.0 - 10.5 K/uL   RBC 4.63  4.22 - 5.81 MIL/uL   Hemoglobin 14.3  13.0 - 17.0 g/dL   HCT 41.2  39.0 - 52.0 %   MCV 89.0  78.0 - 100.0 fL   MCH 30.9  26.0 - 34.0 pg   MCHC 34.7  30.0 - 36.0 g/dL   RDW 13.2  11.5 - 15.5 %   Platelets 382  150 - 400 K/uL   Neutrophils Relative % 63  43 - 77 %   Neutro Abs 5.8  1.7 - 7.7 K/uL   Lymphocytes Relative 22  12 - 46 %   Lymphs Abs 2.0  0.7 - 4.0  K/uL   Monocytes Relative 14 (*) 3 - 12 %   Monocytes Absolute 1.2 (*) 0.1 - 1.0 K/uL   Eosinophils Relative 1  0 - 5 %   Eosinophils Absolute 0.1  0.0 - 0.7 K/uL   Basophils Relative 0  0 - 1 %   Basophils Absolute 0.0  0.0 - 0.1 K/uL  BASIC METABOLIC PANEL     Status: Abnormal   Collection Time    04/27/13  6:45 PM      Result Value Ref Range   Sodium 137  137 - 147 mEq/L   Potassium 4.3  3.7 - 5.3 mEq/L   Chloride 98  96 - 112 mEq/L   CO2 25  19 - 32 mEq/L   Glucose, Bld 130 (*) 70 - 99 mg/dL   BUN 13  6 - 23 mg/dL   Creatinine, Ser 1.21  0.50 - 1.35 mg/dL   Calcium 9.5  8.4 - 10.5 mg/dL   GFR calc non Af Amer 67 (*) >90 mL/min   GFR calc Af Wyvonnia Lora  77 (*) >90 mL/min   Comment: (NOTE)     The eGFR has been calculated using the CKD EPI equation.     This calculation has not been validated in all clinical situations.     eGFR's persistently <90 mL/min signify possible Chronic Kidney     Disease.  CULTURE, ROUTINE-ABSCESS     Status: None   Collection Time    04/28/13  1:45 PM      Result Value Ref Range   Specimen Description ABSCESS LEFT ABDOMEN     Special Requests Normal     Gram Stain       Value: ABUNDANT WBC PRESENT,BOTH PMN AND MONONUCLEAR     NO SQUAMOUS EPITHELIAL CELLS SEEN     MODERATE GRAM POSITIVE COCCI     IN PAIRS FEW GRAM POSITIVE RODS     FEW GRAM NEGATIVE RODS   Culture       Value: Culture reincubated for better growth     Performed at [facility]   Report Status PENDING    CBC     Status: Abnormal   Collection Time    04/29/13  3:39 AM      Result Value Ref Range   WBC 11.4 (*) 4.0 - 10.5 K/uL   RBC 4.28  4.22 - 5.81 MIL/uL   Hemoglobin 12.7 (*) 13.0 - 17.0 g/dL   HCT 38.8 (*) 39.0 - 52.0 %   MCV 90.7  78.0 - 100.0 fL   MCH 29.7  26.0 - 34.0 pg   MCHC 32.7  30.0 - 36.0 g/dL   RDW 13.4  11.5 - 15.5 %   Platelets 375  150 - 400 K/uL  BASIC METABOLIC PANEL     Status: Abnormal   Collection Time    04/29/13  3:39 AM      Result Value  Ref Range   Sodium 135 (*) 137 - 147 mEq/L   Potassium 4.1  3.7 - 5.3 mEq/L   Chloride 98  96 - 112 mEq/L   CO2 23  19 - 32 mEq/L   Glucose, Bld 119 (*) 70 - 99 mg/dL   BUN 10  6 - 23 mg/dL   Creatinine, Ser 1.19  0.50 - 1.35 mg/dL   Calcium 8.8  8.4 - 10.5 mg/dL   GFR calc non Af Amer 68 (*) >90 mL/min   GFR calc Af Amer 79 (*) >90 mL/min   Comment: (NOTE)     The eGFR has been calculated using the CKD EPI equation.     This calculation has not been validated in all clinical situations.     eGFR's persistently <90 mL/min signify possible Chronic Kidney     Disease.    Ct Image Guided Drainage By Percutaneous Catheter  04/28/2013   CLINICAL DATA:  Recurrent diverticular abscess.  EXAM: CT GUIDED DRAINAGE OF PELVIC ABSCESS  ANESTHESIA/SEDATION: Intravenous Fentanyl and Versed were administered as conscious sedation during continuous cardiorespiratory monitoring by the radiology RN, with a total moderate sedation time of 10 minutes.  PROCEDURE: The procedure, risks, benefits, and alternatives were explained to the patient. Questions regarding the procedure were encouraged and answered. The patient understands and consents to the procedure.  Select axial scans through the lower pelvis were obtained an the dominant collection was localized. An appropriate skin entry site was determined.  The overlying skin was prepped with Betadinein a sterile fashion, and a sterile drape was applied covering the operative field. A sterile gown and sterile gloves  were used for the procedure. Local anesthesia was provided with 1% Lidocaine.  Under CT fluoroscopic guidance, a 19 gauge percutaneous entry needle was advanced into the collection. Purulent fluid spontaneously returned through the needle hub. A guidewire advanced easily through the needle, its position confirmed on CT. The tract was dilated to facilitate placement of 12 French pigtail catheter, formed centrally within the collection. Catheter placement  confirmed on CT. The catheter was secured externally with 0 Prolene suture and StatLock and placed to gravity bag. A total of approximately 20 mL purulent material were aspirated. A sample was sent for routine Gram stain, culture and sensitivity. The patient tolerated the procedure well.  COMPLICATIONS: No immediate  FINDINGS: The loculated pelvic gas and fluid collection immediately cephalad to the urinary bladder was again identified. Percutaneous drain catheter placed under CT guidance.  IMPRESSION: 1. Technically successful CT-guided pelvic abscess drain catheter placement.   Electronically Signed   By: Arne Cleveland M.D.   On: 04/28/2013 14:08    Review of Systems  Gastrointestinal: Positive for abdominal pain. Negative for nausea, vomiting, diarrhea, constipation, blood in stool and melena.  All other systems reviewed and are negative.  Blood pressure 105/69, pulse 88, temperature 98.1 F (36.7 C), temperature source Oral, resp. rate 18, height 5' 6"  (1.676 m), weight 176 lb (79.833 kg), SpO2 93.00%. Physical Exam  Constitutional: He appears well-developed and well-nourished. No distress.  HENT:  Head: Normocephalic and atraumatic.  Eyes: Right eye exhibits no discharge. Left eye exhibits no discharge. No scleral icterus.  Cardiovascular: Normal rate, regular rhythm and normal heart sounds.  Exam reveals no gallop and no friction rub.   No murmur heard. Respiratory: Effort normal and breath sounds normal. No respiratory distress. He has no wheezes. He has no rales.  GI: Soft. Bowel sounds are normal. He exhibits no distension. There is tenderness (RLQ). There is no rebound and no guarding.  Musculoskeletal: He exhibits no edema and no tenderness.  Lymphadenopathy:    He has no cervical adenopathy.  Neurological: He is alert.  Skin: Skin is warm and dry. He is not diaphoretic.  Psychiatric: He has a normal mood and affect. His behavior is normal. Judgment and thought content normal.     Assessment/Plan: Diverticulitis w/abscess -- Agree with current treatment. Patient will need follow-up as an outpatient a couple of weeks after discharge. Should discharge on antibiotics (likely Augmentin unless culture allows a more tailored drug) +/- drain depending on clinical course. We will follow along with you while he is here.  Thank you for the consult on this most pleasant gentleman.    Lisette Abu, PA-C Pager: [phone] General Trauma PA Pager: [phone] 04/29/2013, 2:06 PM

## 2013-04-30 LAB — CBC
HEMATOCRIT: 39.9 % (ref 39.0–52.0)
HEMOGLOBIN: 13.5 g/dL (ref 13.0–17.0)
MCH: 30.2 pg (ref 26.0–34.0)
MCHC: 33.8 g/dL (ref 30.0–36.0)
MCV: 89.3 fL (ref 78.0–100.0)
Platelets: 365 10*3/uL (ref 150–400)
RBC: 4.47 MIL/uL (ref 4.22–5.81)
RDW: 13.2 % (ref 11.5–15.5)
WBC: 7.9 10*3/uL (ref 4.0–10.5)

## 2013-04-30 LAB — BASIC METABOLIC PANEL
BUN: 12 mg/dL (ref 6–23)
CO2: 25 mEq/L (ref 19–32)
Calcium: 9.2 mg/dL (ref 8.4–10.5)
Chloride: 100 mEq/L (ref 96–112)
Creatinine, Ser: 1.25 mg/dL (ref 0.50–1.35)
GFR calc non Af Amer: 64 mL/min — ABNORMAL LOW (ref >90)
GFR, EST AFRICAN AMERICAN: 74 mL/min — AB (ref >90)
GLUCOSE: 110 mg/dL — AB (ref 70–99)
POTASSIUM: 4.4 meq/L (ref 3.7–5.3)
Sodium: 138 mEq/L (ref 137–147)

## 2013-04-30 MED ORDER — DOCUSATE SODIUM 100 MG PO CAPS
100.0000 mg | ORAL_CAPSULE | Freq: Two times a day (BID) | ORAL | Status: DC
  Administered 2013-04-30 – 2013-05-01 (×3): 100 mg via ORAL
  Filled 2013-04-30 (×3): qty 1

## 2013-04-30 NOTE — Progress Notes (Signed)
PROGRESS NOTE  Bobby [name] ZOX:[id number] DOB: [birth year] DOA: 04/27/2013 PCP: Kirstie Peri, MD  Assessment/Plan: Diverticulitis of large intestine with abscess without bleeding  IR: perc drainage. Plan to d/c with drain in place -zosyn- on d/c with use augmentin -Pain and nausea meds.- trying to just use PO pain meds -culture pending -Per Dr. Michaell Cowing: close f/u in office to see if needs repaeat CT scan/drain study. With complex attack & repated atteack, pt would benefit from colectomy. Try to switch to elective situation in ~6 weeks to min chance of temporary ostomy & preop colonoscopy.    Leukocytosis- -monitor   Code Status: full Family Communication: family at bedside Disposition Plan: home 1-2 days   Consultants:  IR  Procedures:     HPI/Subjective:  No new c/o Eating well    Objective: Filed Vitals:   04/30/13 0547  BP: 99/71  Pulse: 65  Temp: 97.5 F (36.4 C)  Resp: 20    Intake/Output Summary (Last 24 hours) at 04/30/13 0933 Last data filed at 04/30/13 0550  Gross per 24 hour  Intake   1090 ml  Output    456 ml  Net    634 ml   Filed Weights   04/27/13 1754  Weight: 79.833 kg (176 lb)    Exam:   General:  A+Ox3, NAD- up walking in room  Cardiovascular: rrr  Respiratory: clear anterior  Abdomen: +BS, soft, min LLQ tenderness  Musculoskeletal: no edema   Data Reviewed: Basic Metabolic Panel:  Recent Labs Lab 04/27/13 1845 04/29/13 0339 04/30/13 0609  NA 137 135* 138  K 4.3 4.1 4.4  CL 98 98 100  CO2 25 23 25   GLUCOSE 130* 119* 110*  BUN 13 10 12   CREATININE 1.21 1.19 1.25  CALCIUM 9.5 8.8 9.2   Liver Function Tests: No results found for this basename: AST, ALT, ALKPHOS, BILITOT, PROT, ALBUMIN,  in the last 168 hours No results found for this basename: LIPASE, AMYLASE,  in the last 168 hours No results found for this basename: AMMONIA,  in the last 168 hours CBC:  Recent Labs Lab 04/27/13 1845 04/29/13 0339  04/30/13 0609  WBC 9.1 11.4* 7.9  NEUTROABS 5.8  --   --   HGB 14.3 12.7* 13.5  HCT 41.2 38.8* 39.9  MCV 89.0 90.7 89.3  PLT 382 375 365   Cardiac Enzymes: No results found for this basename: CKTOTAL, CKMB, CKMBINDEX, TROPONINI,  in the last 168 hours BNP (last 3 results) No results found for this basename: PROBNP,  in the last 8760 hours CBG: No results found for this basename: GLUCAP,  in the last 168 hours  Recent Results (from the past 240 hour(s))  CULTURE, ROUTINE-ABSCESS     Status: None   Collection Time    04/28/13  1:45 PM      Result Value Ref Range Status   Specimen Description ABSCESS LEFT ABDOMEN   Final   Special Requests Normal   Final   Gram Stain     Final   Value: ABUNDANT WBC PRESENT,BOTH PMN AND MONONUCLEAR     NO SQUAMOUS EPITHELIAL CELLS SEEN     MODERATE GRAM POSITIVE COCCI     IN PAIRS FEW GRAM POSITIVE RODS     FEW GRAM NEGATIVE RODS   Culture     Final   Value: MODERATE GRAM NEGATIVE RODS     Performed at [facility]   Report Status PENDING   Incomplete     Studies: Ct Image  Guided Drainage By Percutaneous Catheter  04/28/2013   CLINICAL DATA:  Recurrent diverticular abscess.  EXAM: CT GUIDED DRAINAGE OF PELVIC ABSCESS  ANESTHESIA/SEDATION: Intravenous Fentanyl and Versed were administered as conscious sedation during continuous cardiorespiratory monitoring by the radiology RN, with a total moderate sedation time of 10 minutes.  PROCEDURE: The procedure, risks, benefits, and alternatives were explained to the patient. Questions regarding the procedure were encouraged and answered. The patient understands and consents to the procedure.  Select axial scans through the lower pelvis were obtained an the dominant collection was localized. An appropriate skin entry site was determined.  The overlying skin was prepped with Betadinein a sterile fashion, and a sterile drape was applied covering the operative field. A sterile gown and sterile gloves were  used for the procedure. Local anesthesia was provided with 1% Lidocaine.  Under CT fluoroscopic guidance, a 19 gauge percutaneous entry needle was advanced into the collection. Purulent fluid spontaneously returned through the needle hub. A guidewire advanced easily through the needle, its position confirmed on CT. The tract was dilated to facilitate placement of 12 French pigtail catheter, formed centrally within the collection. Catheter placement confirmed on CT. The catheter was secured externally with 0 Prolene suture and StatLock and placed to gravity bag. A total of approximately 20 mL purulent material were aspirated. A sample was sent for routine Gram stain, culture and sensitivity. The patient tolerated the procedure well.  COMPLICATIONS: No immediate  FINDINGS: The loculated pelvic gas and fluid collection immediately cephalad to the urinary bladder was again identified. Percutaneous drain catheter placed under CT guidance.  IMPRESSION: 1. Technically successful CT-guided pelvic abscess drain catheter placement.   Electronically Signed   By: Oley Balmaniel  Hassell M.D.   On: 04/28/2013 14:08    Scheduled Meds: . piperacillin-tazobactam (ZOSYN)  IV  3.375 g Intravenous 3 times per day  . polyethylene glycol  17 g Oral Daily   Continuous Infusions:   Antibiotics Given (last 72 hours)   Date/Time Action Medication Dose Rate   04/27/13 2056 Given   Ampicillin-Sulbactam (UNASYN) 3 g in sodium chloride 0.9 % 100 mL IVPB 3 g 100 mL/hr   04/28/13 0150 Given   Ampicillin-Sulbactam (UNASYN) 3 g in sodium chloride 0.9 % 100 mL IVPB 3 g 100 mL/hr   04/28/13 0657 Given   Ampicillin-Sulbactam (UNASYN) 3 g in sodium chloride 0.9 % 100 mL IVPB 3 g 100 mL/hr   04/28/13 1211 Given   Ampicillin-Sulbactam (UNASYN) 3 g in sodium chloride 0.9 % 100 mL IVPB 3 g 100 mL/hr   04/28/13 1807 Given   Ampicillin-Sulbactam (UNASYN) 3 g in sodium chloride 0.9 % 100 mL IVPB 3 g 100 mL/hr   04/29/13 0151 Given    Ampicillin-Sulbactam (UNASYN) 3 g in sodium chloride 0.9 % 100 mL IVPB 3 g 100 mL/hr   04/29/13 0611 Given   Ampicillin-Sulbactam (UNASYN) 3 g in sodium chloride 0.9 % 100 mL IVPB 3 g 100 mL/hr   04/29/13 1241 Given   Ampicillin-Sulbactam (UNASYN) 3 g in sodium chloride 0.9 % 100 mL IVPB 3 g 100 mL/hr   04/29/13 1720 Given   piperacillin-tazobactam (ZOSYN) IVPB 3.375 g 3.375 g 12.5 mL/hr   04/29/13 2153 Given   piperacillin-tazobactam (ZOSYN) IVPB 3.375 g 3.375 g 12.5 mL/hr   04/30/13 0535 Given   piperacillin-tazobactam (ZOSYN) IVPB 3.375 g 3.375 g 12.5 mL/hr      Active Problems:   Diverticulitis of large intestine with abscess without bleeding    Time spent:  35 min    Joseph ArtJessica U Deyonte Cadden  [facility] Pager [phone]. If 7PM-7AM, please contact night-coverage at [email or web address], password [email or web address] 04/30/2013, 9:33 AM  LOS: 3 days

## 2013-04-30 NOTE — Progress Notes (Signed)
[facility], [facility], [address] [place], [place] [address] Phone: [phone] FAX: [phone]    Bobby [name] [id number] 20-Feb-1960  CARE TEAM:  PCP: Monico Blitz, MD  Outpatient Care Team: Patient Care Team: Monico Blitz, MD as PCP - General (Internal Medicine)  Inpatient Treatment Team: Treatment Team: Attending Provider: Geradine Girt, DO; Registered Nurse: Micki Riley, RN; Registered Nurse: Claiborne Billings, RN; Rounding Team: Minerva Ends, MD; Registered Nurse: Nilda Calamity, RN; Technician: Dorris Singh, NT; Respiratory Therapist: Isaac Bliss, RRT; Consulting Physician: Md Edison Pace, MD; Registered Nurse: Anola Gurney, RN; Technician: Army Chaco, NT   Subjective:  Feeling better Wife in room Trying solids Feels rather full  Objective:  Vital signs:  Filed Vitals:   04/29/13 1023 04/29/13 1417 04/29/13 2131 04/30/13 0547  BP: 105/69 102/69 107/74 99/71  Pulse: 88 71 85 65  Temp: 98.1 F (36.7 C) 98 F (36.7 C) 99.3 F (37.4 C) 97.5 F (36.4 C)  TempSrc: Oral Oral Oral Oral  Resp: _0 Height:      Weight:      SpO2: 93% 96% 99% 99%    Last BM Date: 04/27/13  Intake/Output   Yesterday:  04/24 0701 - 04/25 0700 In: 1090 [P.O.:1080] Out: 456 [Urine:425; Drains:30; Stool:1] This shift:     Bowel function:  Flatus: y  BM: y  Drain: thin bloody - no stool  Physical Exam:  General: Pt awake/alert/oriented x4 in no acute distress Eyes: PERRL, normal EOM.  Sclera clear.  No icterus Neuro: CN II-XII intact w/o focal sensory/motor deficits. Lymph: No head/neck/groin lymphadenopathy Psych:  No delerium/psychosis/paranoia HENT: Normocephalic, Mucus membranes moist.  No thrush Neck: Supple, No tracheal deviation Chest: No chest wall pain w good excursion CV:  Pulses intact.  Regular rhythm MS: Normal AROM mjr joints.  No obvious deformity Abdomen:  Soft.  Mod distended.  Mildly tender LLQ.  No evidence of peritonitis.  No incarcerated hernias. Ext:  SCDs BLE.  No mjr edema.  No cyanosis Skin: No petechiae / purpura   Problem List:   Active Problems:   Diverticulitis of large intestine with abscess without bleeding   Assessment  Bobby [name]  53 y.o. male       Improving  Plan:  -IV ABX - f/u Cx.   -bland solids as tolerated -bowel regimen -drain care -VTE prophylaxis- SCDs, etc -mobilize as tolerated to help recovery  D/C patient from hospital when patient meets criteria (anticipate in 1-2 day(s)):  Tolerating oral intake well Ambulating in walkways Adequate pain control without IV medications Urinating  Having flatus  D/c w drain & PO ABX (prob Augmentin) & close f/u in office to see if needs repaeat CT scan/drain study.  With complex attack & repated atteack, pt would benefit from colectomy.  Try to switch to elective situation in ~6 weeks to min chance of temporary ostomy & preop colonoscopy.  I updated the patient's status to the pt & wife.  Recommendations were made.  Questions were answered.  They expressed understanding & appreciation.    Adin Hector, M.D., F.A.C.S. Gastrointestinal and Minimally Invasive Surgery [facility] [address], [place], [place] [address] [phone] Main / Paging   04/30/2013   Results:   Labs: Results for orders placed during the hospital encounter of 04/27/13 (from the past 48 hour(s))  CULTURE, ROUTINE-ABSCESS  Status: None   Collection Time    04/28/13  1:45 PM      Result Value Ref Range   Specimen Description ABSCESS LEFT ABDOMEN     Special Requests Normal     Gram Stain       Value: ABUNDANT WBC PRESENT,BOTH PMN AND MONONUCLEAR     NO SQUAMOUS EPITHELIAL CELLS SEEN     MODERATE GRAM POSITIVE COCCI     IN PAIRS FEW GRAM POSITIVE RODS     FEW GRAM NEGATIVE RODS   Culture       Value: MODERATE GRAM NEGATIVE RODS      Performed at [facility]   Report Status PENDING    CBC     Status: Abnormal   Collection Time    04/29/13  3:39 AM      Result Value Ref Range   WBC 11.4 (*) 4.0 - 10.5 K/uL   RBC 4.28  4.22 - 5.81 MIL/uL   Hemoglobin 12.7 (*) 13.0 - 17.0 g/dL   HCT 38.8 (*) 39.0 - 52.0 %   MCV 90.7  78.0 - 100.0 fL   MCH 29.7  26.0 - 34.0 pg   MCHC 32.7  30.0 - 36.0 g/dL   RDW 13.4  11.5 - 15.5 %   Platelets 375  150 - 400 K/uL  BASIC METABOLIC PANEL     Status: Abnormal   Collection Time    04/29/13  3:39 AM      Result Value Ref Range   Sodium 135 (*) 137 - 147 mEq/L   Potassium 4.1  3.7 - 5.3 mEq/L   Chloride 98  96 - 112 mEq/L   CO2 23  19 - 32 mEq/L   Glucose, Bld 119 (*) 70 - 99 mg/dL   BUN 10  6 - 23 mg/dL   Creatinine, Ser 1.19  0.50 - 1.35 mg/dL   Calcium 8.8  8.4 - 10.5 mg/dL   GFR calc non Af Amer 68 (*) >90 mL/min   GFR calc Af Amer 79 (*) >90 mL/min   Comment: (NOTE)     The eGFR has been calculated using the CKD EPI equation.     This calculation has not been validated in all clinical situations.     eGFR's persistently <90 mL/min signify possible Chronic Kidney     Disease.  CBC     Status: None   Collection Time    04/30/13  6:09 AM      Result Value Ref Range   WBC 7.9  4.0 - 10.5 K/uL   RBC 4.47  4.22 - 5.81 MIL/uL   Hemoglobin 13.5  13.0 - 17.0 g/dL   HCT 39.9  39.0 - 52.0 %   MCV 89.3  78.0 - 100.0 fL   MCH 30.2  26.0 - 34.0 pg   MCHC 33.8  30.0 - 36.0 g/dL   RDW 13.2  11.5 - 15.5 %   Platelets 365  150 - 400 K/uL  BASIC METABOLIC PANEL     Status: Abnormal   Collection Time    04/30/13  6:09 AM      Result Value Ref Range   Sodium 138  137 - 147 mEq/L   Potassium 4.4  3.7 - 5.3 mEq/L   Chloride 100  96 - 112 mEq/L   CO2 25  19 - 32 mEq/L   Glucose, Bld 110 (*) 70 - 99 mg/dL   BUN 12  6 - 23 mg/dL  Creatinine, Ser 1.25  0.50 - 1.35 mg/dL   Calcium 9.2  8.4 - 10.5 mg/dL   GFR calc non Af Amer 64 (*) >90 mL/min   GFR calc Af Amer 74 (*)  >90 mL/min   Comment: (NOTE)     The eGFR has been calculated using the CKD EPI equation.     This calculation has not been validated in all clinical situations.     eGFR's persistently <90 mL/min signify possible Chronic Kidney     Disease.    Imaging / Studies: Ct Image Guided Drainage By Percutaneous Catheter  04/28/2013   CLINICAL DATA:  Recurrent diverticular abscess.  EXAM: CT GUIDED DRAINAGE OF PELVIC ABSCESS  ANESTHESIA/SEDATION: Intravenous Fentanyl and Versed were administered as conscious sedation during continuous cardiorespiratory monitoring by the radiology RN, with a total moderate sedation time of 10 minutes.  PROCEDURE: The procedure, risks, benefits, and alternatives were explained to the patient. Questions regarding the procedure were encouraged and answered. The patient understands and consents to the procedure.  Select axial scans through the lower pelvis were obtained an the dominant collection was localized. An appropriate skin entry site was determined.  The overlying skin was prepped with Betadinein a sterile fashion, and a sterile drape was applied covering the operative field. A sterile gown and sterile gloves were used for the procedure. Local anesthesia was provided with 1% Lidocaine.  Under CT fluoroscopic guidance, a 19 gauge percutaneous entry needle was advanced into the collection. Purulent fluid spontaneously returned through the needle hub. A guidewire advanced easily through the needle, its position confirmed on CT. The tract was dilated to facilitate placement of 12 French pigtail catheter, formed centrally within the collection. Catheter placement confirmed on CT. The catheter was secured externally with 0 Prolene suture and StatLock and placed to gravity bag. A total of approximately 20 mL purulent material were aspirated. A sample was sent for routine Gram stain, culture and sensitivity. The patient tolerated the procedure well.  COMPLICATIONS: No immediate   FINDINGS: The loculated pelvic gas and fluid collection immediately cephalad to the urinary bladder was again identified. Percutaneous drain catheter placed under CT guidance.  IMPRESSION: 1. Technically successful CT-guided pelvic abscess drain catheter placement.   Electronically Signed   By: Arne Cleveland M.D.   On: 04/28/2013 14:08    Medications / Allergies: per chart  Antibiotics: Anti-infectives   Start     Dose/Rate Route Frequency Ordered Stop   04/29/13 1600  piperacillin-tazobactam (ZOSYN) IVPB 3.375 g     3.375 g 12.5 mL/hr over 240 Minutes Intravenous 3 times per day 04/29/13 1540     04/27/13 1900  Ampicillin-Sulbactam (UNASYN) 3 g in sodium chloride 0.9 % 100 mL IVPB  Status:  Discontinued     3 g 100 mL/hr over 60 Minutes Intravenous Every 6 hours 04/27/13 1822 04/29/13 1459       Note: This dictation was prepared with Dragon/digital dictation along with [employer]. Any transcriptional errors that result from this process are unintentional.

## 2013-04-30 NOTE — Progress Notes (Signed)
Subjective: Feeling pretty good this morning.  Objective: Physical Exam: BP 99/71  Pulse 65  Temp(Src) 97.5 F (36.4 C) (Oral)  Resp 20  Ht 5\' 6"  (1.676 m)  Wt 176 lb (79.833 kg)  BMI 28.42 kg/m2  SpO2 99%  General: A&Ox3, NAD Abd: Soft, LLQ drain intact Serosanguinous output, some purulent stranding  Labs: CBC  Recent Labs  04/29/13 0339 04/30/13 0609  WBC 11.4* 7.9  HGB 12.7* 13.5  HCT 38.8* 39.9  PLT 375 365   BMET  Recent Labs  04/29/13 0339 04/30/13 0609  NA 135* 138  K 4.1 4.4  CL 98 100  CO2 23 25  GLUCOSE 119* 110*  BUN 10 12  CREATININE 1.19 1.25  CALCIUM 8.8 9.2   LFT No results found for this basename: PROT, ALBUMIN, AST, ALT, ALKPHOS, BILITOT, BILIDIR, IBILI, LIPASE,  in the last 72 hours PT/INR  Recent Labs  04/27/13 1844  LABPROT 14.1  INR 1.11     Studies/Results: Ct Image Guided Drainage By Percutaneous Catheter  04/28/2013   CLINICAL DATA:  Recurrent diverticular abscess.  EXAM: CT GUIDED DRAINAGE OF PELVIC ABSCESS  ANESTHESIA/SEDATION: Intravenous Fentanyl and Versed were administered as conscious sedation during continuous cardiorespiratory monitoring by the radiology RN, with a total moderate sedation time of 10 minutes.  PROCEDURE: The procedure, risks, benefits, and alternatives were explained to the patient. Questions regarding the procedure were encouraged and answered. The patient understands and consents to the procedure.  Select axial scans through the lower pelvis were obtained an the dominant collection was localized. An appropriate skin entry site was determined.  The overlying skin was prepped with Betadinein a sterile fashion, and a sterile drape was applied covering the operative field. A sterile gown and sterile gloves were used for the procedure. Local anesthesia was provided with 1% Lidocaine.  Under CT fluoroscopic guidance, a 19 gauge percutaneous entry needle was advanced into the collection. Purulent fluid  spontaneously returned through the needle hub. A guidewire advanced easily through the needle, its position confirmed on CT. The tract was dilated to facilitate placement of 12 French pigtail catheter, formed centrally within the collection. Catheter placement confirmed on CT. The catheter was secured externally with 0 Prolene suture and StatLock and placed to gravity bag. A total of approximately 20 mL purulent material were aspirated. A sample was sent for routine Gram stain, culture and sensitivity. The patient tolerated the procedure well.  COMPLICATIONS: No immediate  FINDINGS: The loculated pelvic gas and fluid collection immediately cephalad to the urinary bladder was again identified. Percutaneous drain catheter placed under CT guidance.  IMPRESSION: 1. Technically successful CT-guided pelvic abscess drain catheter placement.   Electronically Signed   By: Oley Balmaniel  Hassell M.D.   On: 04/28/2013 14:08    Assessment/Plan: Diverticulitis with perforation and abscess S/p 52F perc drain placed 04/28/13, output mix of blood/purulence, but seems to be thinning Will need daily flushes after discharge WBC back down to normal today Plans per [facility] and primary    LOS: 3 days    Brayton ElKevin Jonanthan Bolender PA-C 04/30/2013 12:03 PM

## 2013-05-01 LAB — CULTURE, ROUTINE-ABSCESS: SPECIAL REQUESTS: NORMAL

## 2013-05-01 MED ORDER — SULFAMETHOXAZOLE-TMP DS 800-160 MG PO TABS: 1.0000 | ORAL_TABLET | Freq: Two times a day (BID) | ORAL | Status: DC

## 2013-05-01 MED ORDER — HYDROCODONE-ACETAMINOPHEN 5-325 MG PO TABS: 1.0000 | ORAL_TABLET | Freq: Four times a day (QID) | ORAL | Status: DC | PRN

## 2013-05-01 MED ORDER — METRONIDAZOLE 500 MG PO TABS: 500.0000 mg | ORAL_TABLET | Freq: Three times a day (TID) | ORAL | Status: DC

## 2013-05-01 NOTE — Discharge Instructions (Signed)
Diverticulitis °A diverticulum is a small pouch or sac on the colon. Diverticulosis is the presence of these diverticula on the colon. Diverticulitis is the irritation (inflammation) or infection of diverticula. °CAUSES  °The colon and its diverticula contain bacteria. If food particles block the tiny opening to a diverticulum, the bacteria inside can grow and cause an increase in pressure. This leads to infection and inflammation and is called diverticulitis. °SYMPTOMS  °· Abdominal pain and tenderness. Usually, the pain is located on the left side of your abdomen. However, it could be located elsewhere. °· Fever. °· Bloating. °· Feeling sick to your stomach (nausea). °· Throwing up (vomiting). °· Abnormal stools. °DIAGNOSIS  °Your caregiver will take a history and perform a physical exam. Since many things can cause abdominal pain, other tests may be necessary. Tests may include: °· Blood tests. °· Urine tests. °· X-ray of the abdomen. °· CT scan of the abdomen. °Sometimes, surgery is needed to determine if diverticulitis or other conditions are causing your symptoms. °TREATMENT  °Most of the time, you can be treated without surgery. Treatment includes: °· Resting the bowels by only having liquids for a few days. As you improve, you will need to eat a low-fiber diet. °· Intravenous (IV) fluids if you are losing body fluids (dehydrated). °· Antibiotic medicines that treat infections may be given. °· Pain and nausea medicine, if needed. °· Surgery if the inflamed diverticulum has burst. °HOME CARE INSTRUCTIONS  °· Try a clear liquid diet (broth, tea, or water for as long as directed by your caregiver). You may then gradually begin a low-fiber diet as tolerated.  °A low-fiber diet is a diet with less than 10 grams of fiber. Choose the foods below to reduce fiber in the diet: °· White breads, cereals, rice, and pasta. °· Cooked fruits and vegetables or soft fresh fruits and vegetables without the skin. °· Ground or  well-cooked tender beef, ham, veal, lamb, pork, or poultry. °· Eggs and seafood. °· After your diverticulitis symptoms have improved, your caregiver may put you on a high-fiber diet. A high-fiber diet includes 14 grams of fiber for every 1000 calories consumed. For a standard 2000 calorie diet, you would need 28 grams of fiber. Follow these diet guidelines to help you increase the fiber in your diet. It is important to slowly increase the amount fiber in your diet to avoid gas, constipation, and bloating. °· Choose whole-grain breads, cereals, pasta, and brown rice. °· Choose fresh fruits and vegetables with the skin on. Do not overcook vegetables because the more vegetables are cooked, the more fiber is lost. °· Choose more nuts, seeds, legumes, dried peas, beans, and lentils. °· Look for food products that have greater than 3 grams of fiber per serving on the Nutrition Facts label. °· Take all medicine as directed by your caregiver. °· If your caregiver has given you a follow-up appointment, it is very important that you go. Not going could result in lasting (chronic) or permanent injury, pain, and disability. If there is any problem keeping the appointment, call to reschedule. °SEEK MEDICAL CARE IF:  °· Your pain does not improve. °· You have a hard time advancing your diet beyond clear liquids. °· Your bowel movements do not return to normal. °SEEK IMMEDIATE MEDICAL CARE IF:  °· Your pain becomes worse. °· You have an oral temperature above 102° F (38.9° C), not controlled by medicine. °· You have repeated vomiting. °· You have bloody or black, tarry stools. °·   Symptoms that brought you to your caregiver become worse or are not getting better. MAKE SURE YOU:   Understand these instructions.  Will watch your condition.  Will get help right away if you are not doing well or get worse. Document Released: 10/02/2004 Document Revised: 03/17/2011 Document Reviewed: 01/28/2010 [facility] Patient Information  2014 [place].  DRAIN CARE:   You have a closed bulb drain to help you heal.  A bulb drain is a small, plastic reservoir which creates a gentle suction. It is used to remove excess fluid from a surgical wound. The color and amount of fluid will vary. Immediately after surgery, the fluid is bright red. It may gradually change to a yellow color. When the amount decreases to about 1 or 2 tablespoons (15 to 30 cc) per 24 hours, your caregiver will usually remove it.  DAILY CARE  Keep the bulb compressed at all times, except while emptying it. The compression creates suction.   Keep sites where the tubes enter the skin dry and covered with a light bandage (dressing).   Tape the tubes to your skin, 1 to 2 inches below the insertion sites, to keep from pulling on your stitches. Tubes are stitched in place and will not slip out.   Pin the bulb to your shirt (not to your pants) with a safety pin.   For the first few days after surgery, there usually is more fluid in the bulb. Empty the bulb whenever it becomes half full because the bulb does not create enough suction if it is too full. Include this amount in your 24 hour totals.   When the amount of drainage decreases, empty the bulb at the same time every day. Write down the amounts and the 24 hour totals. Your caregiver will want to know them. This helps your caregiver know when the tubes can be removed.   (We anticipate removing the drain in 1-3 weeks, depending on when the output is <1130mL a day for 2+ days)  If there is drainage around the tube sites, change dressings and keep the area dry. If you see a clot in the tube, leave it alone. However, if the tube does not appear to be draining, let your caregiver know.  TO EMPTY THE BULB  Open the stopper to release suction.   Holding the stopper out of the way, pour drainage into the measuring cup that was sent home with you.   Measure and write down the amount. If there are 2 bulbs, note  the amount of drainage from bulb 1 or bulb 2 and keep the totals separate. Your caregiver will want to know which tube is draining more.   Compress the bulb by folding it in half.   Replace the stopper.   Check the tape that holds the tube to your skin, and pin the bulb to your shirt.  SEEK MEDICAL CARE IF:  The drainage develops a bad odor.   You have an oral temperature above 102 F (38.9 C).   The amount of drainage from your wound suddenly increases or decreases.   You accidentally pull out your drain.   You have any other questions or concerns.  MAKE SURE YOU:   Understand these instructions.   Will watch your condition.   Will get help right away if you are not doing well or get worse.     Call our office if you have any questions about your drain. [phone]  While you are recovering from this  recurrent diverticulitis attack, Eat a Low-Fiber Diet Fiber is found in fruits, vegetables, and grains. A low-fiber diet restricts fibrous foods that are not digested in the small intestine. A diet containing about 10 grams of fiber is considered low fiber.  PURPOSE  To prevent blockage of a partially obstructed or narrowed gastrointestinal tract.  To reduce fecal weight and volume.  To slow the movement of feces. WHEN IS THIS DIET USED?  It may be used during the acute phase of Crohn disease, ulcerative colitis, regional enteritis, or diverticulitis.  It may be used if your intestinal or esophageal tubes are narrowing (stenosis).  It may be used as a transitional diet following surgery, injury (trauma), or illness. CHOOSING FOODS Check labels, especially on foods from the starch list. Often times, dietary fiber content is listed on the nutrition facts panel. Please ask your Registered Dietitian if you have questions about specific foods that are related to your condition, especially if the food is not listed on this handout. Breads and Starches  Allowed: White,  [place], and pita breads, plain rolls, buns, or sweet rolls, doughnuts, waffles, pancakes, bagels. Plain muffins, biscuits, matzoth. Soda, saltine, graham crackers. Pretzels, rusks, melba toast, zwieback. Cooked cereals: cornmeal, farina, or cream cereals. Dry cereals: refined corn, wheat, rice, and oat cereals (check label). Potatoes prepared any way without skins, refined macaroni, spaghetti, noodles, refined rice.  Avoid: Whole-wheat bread, rolls, and crackers. Multigrains, rye, bran seeds, nuts, or coconut. Cereals containing whole grains, multigrains, bran, coconut, nuts, raisins. Cooked or dry oatmeal. Coarse wheat cereals, granola. Cereals advertised as "high fiber." Potato skins. Whole-grain pasta, wild or brown rice. Popcorn. Vegetables  Allowed: Strained tomato and vegetable juices. Fresh lettuce, cucumber, spinach. Well-cooked or canned: asparagus, bean sprouts, broccoli, cut green beans, cauliflower, pumpkin, beets, mushrooms, yellow squash, tomato, tomato sauce, zucchini, turnips.Keep servings limited to  cup.  Avoid: Fresh, cooked, or canned: artichokes, baked beans, beet greens, Brussels sprouts, corn, kale, legumes, peas, sweet potatoes. Avoid large servings of any vegetables. Fruit  Allowed: All fruit juices except prune juice. Cooked or canned fruits without skin and seeds: apricots, applesauce, cantaloupe, cherries, grapefruit, grapes, kiwi, mandarin oranges, peaches, pears, fruit cocktail, pineapple, plums, watermelon. Fresh without skin: banana, grapes, cantaloupe, avocado, cherries, pineapple, kiwi, nectarines, peaches, blueberries. Keep servings limited to  cup or 1 piece.  Avoid: Fresh: apples with or without skin, apricots, mangoes, pears, raspberries, strawberries. Prune juice and juices with pulp, stewed or dried prunes. Dried fruits, raisins, dates. Avoid large servings of all fresh fruits. Meat and Protein Substitutes  Allowed: Ground or well-cooked tender beef, ham,  veal, lamb, pork, poultry. Eggs, plain cheese. Fish, oysters, shrimp, lobster, other seafood. Liver, organ meats. Smooth nut butters.  Avoid: Tough, fibrous meats with gristle. Chunky nut butter.Cheese with seeds, nuts, or other foods not allowed. Nuts, seeds, legumes, dried peas, beans, lentils. Dairy  Allowed: All milk products except those not allowed.  Avoid: Yogurt or cheese that contains nuts, seeds, or added fruit. Soups and Combination Foods  Allowed: Bouillon, broth, or cream soups made from allowed foods. Any strained soup. Casseroles or mixed dishes made with allowed foods.  Avoid: Soups made from vegetables that are not allowed or that contain other foods not allowed. Desserts and Sweets  Allowed:Plain cakes and cookies, pie made with allowed fruit, pudding, custard, cream pie. Gelatin, fruit, ice, sherbet, frozen ice pops. Ice cream, ice milk without nuts. Plain hard candy, honey, jelly, molasses, syrup, sugar, chocolate syrup, gumdrops, marshmallows.  Avoid: Desserts,  cookies, or candies that contain nuts, peanut butter, dried fruits. Jams, preserves with seeds, marmalade. Fats and Oils  Allowed:Margarine, butter, cream, mayonnaise, salad oils, plain salad dressings made from allowed foods.  Avoid: Seeds, nuts, olives. Beverages  Allowed: All, except those listed to avoid.  Avoid: Fruit juices with high pulp, prune juice. Condiments  Allowed:Ketchup, mustard, horseradish, vinegar, cream sauce, cheese sauce, cocoa powder. Spices in moderation: allspice, basil, bay leaves, celery powder or leaves, cinnamon, cumin powder, curry powder, ginger, mace, marjoram, onion or garlic powder, oregano, paprika, parsley flakes, ground pepper, rosemary, sage, savory, tarragon, thyme, turmeric.  Avoid: Coconut, pickles. SAMPLE MENU Breakfast   cup orange juice.  1 boiled egg.  1 slice white toast.  Margarine.   cup cornflakes.  1 cup milk.  Beverage. Lunch    cup chicken noodle soup.  2 to 3 oz sliced roast beef.  2 slices white bread.  Mayonnaise.   cup tomato juice.  1 small banana.  Beverage. Dinner  3 oz baked chicken.   cup scalloped potatoes.   cup cooked beets.  White dinner roll.  Margarine.   cup canned peaches.  Beverage. Document Released: 06/14/2001 Document Revised: 08/25/2012 Document Reviewed: 01/09/2011 [facility] Patient Information 2014 [place], [place].

## 2013-05-01 NOTE — Progress Notes (Signed)
Pt discharged to home accomp by wife.  Supplies for dressing changes and emptying JP drain given and explained.  Wife will empty drain and she demonstrated this to me with a return demonstration.  They understand to call [facility] if drain falls out or fever >100.5.  They will keep a record of drainage to take to the [facility] appt in 10 days.  All FU appts recviewed and #'s given.  Rx given for vicodin and explained and other Rx's called into [facility] where they live.  [facility].  No further questions about home care.  Flushing regimen reviewed as well and wife demonstrated this to me.

## 2013-05-01 NOTE — Discharge Summary (Signed)
Physician Discharge Summary  Bobby StackWilliam [name] ZOX:[id number] DOB: Jun 05, 1960 DOA: 04/27/2013  PCP: Bobby PeriSHAH,ASHISH, MD  Admit date: 04/27/2013 Discharge date: 05/01/2013  Time spent: 35 minutes  Recommendations for Outpatient Follow-up:  1.   Discharge Diagnoses:  Active Problems:   Diverticulitis of large intestine with abscess without bleeding   Discharge Condition: improved  Diet recommendation: bland  Filed Weights   04/27/13 1754  Weight: 79.833 kg (176 lb)    History of present illness:  Bobby StackWilliam Osuch is an 53 y.o. male who presented complaining of LLQ abdominal pain x 1 week, fever and chills. He had a CT in [place] which revealed diverticulitis perforation with abscess, he was placed on oral antibiotics and sent home. He presented back to the hospital since pain was worsening, repeat CT revealed increasing abscess. He has been placed on IV antibiotics and IR received request for image guided diverticular abscess drainage. He rates his abdominal LLQ pain 8/10, however pain is currently controlled with pain medications. He denies any chest pain, shortness of breath or palpitations. He denies any active signs of bleeding or excessive bruising. The patient denies any history of sleep apnea or chronic oxygen use. He has previously tolerated sedation without complications during a colonoscopy.   Hospital Course:  Diverticulitis of large intestine with abscess without bleeding  IR: perc drainage. Plan to d/c with drain in place  -zosyn- on d/c will use augmentin  -Pain and nausea meds.- trying to just use PO pain meds  -culture pending  -Per Dr. Michaell CowingGross: close f/u in office to see if needs repaeat CT scan/drain study. With complex attack & repated atteack, pt would benefit from colectomy. Try to switch to elective situation in ~6 weeks to min chance of temporary ostomy & preop colonoscopy.   Leukocytosis-  -monitor   Procedures:  IR -drain  Consultations:  IR  Discharge  Exam: Filed Vitals:   05/01/13 0624  BP: 106/62  Pulse: 81  Temp: 97.8 F (36.6 C)  Resp: 18    General: A+OX3, NAD Cardiovascular: rrr Respiratory: clear  Discharge Instructions You were cared for by a hospitalist during your hospital stay. If you have any questions about your discharge medications or the care you received while you were in the hospital after you are discharged, you can call the unit and asked to speak with the hospitalist on call if the hospitalist that took care of you is not available. Once you are discharged, your primary care physician will handle any further medical issues. Please note that NO REFILLS for any discharge medications will be authorized once you are discharged, as it is imperative that you return to your primary care physician (or establish a relationship with a primary care physician if you do not have one) for your aftercare needs so that they can reassess your need for medications and monitor your lab values.  Discharge Orders   Future Orders Complete By Expires   Call MD for:  extreme fatigue  As directed    Call MD for:  hives  As directed    Call MD for:  persistant nausea and vomiting  As directed    Call MD for:  redness, tenderness, or signs of infection (pain, swelling, redness, odor or green/yellow discharge around incision site)  As directed    Call MD for:  severe uncontrolled pain  As directed    Call MD for:  As directed    Diet - low sodium heart healthy  As directed    Discharge  instructions  As directed    Discharge instructions  As directed    Discharge wound care:  As directed    Driving Restrictions  As directed    Increase activity slowly  As directed    Increase activity slowly  As directed    Lifting restrictions  As directed    May shower / Bathe  As directed    May walk up steps  As directed    Sexual Activity Restrictions  As directed    Walk with assistance  As directed        Medication List    STOP taking  these medications       ciprofloxacin 500 MG tablet  Commonly known as:  CIPRO      TAKE these medications       HYDROcodone-acetaminophen 5-325 MG per tablet  Commonly known as:  NORCO/VICODIN  Take 1 tablet by mouth every 6 (six) hours as needed for moderate pain.     metroNIDAZOLE 500 MG tablet  Commonly known as:  FLAGYL  Take 1 tablet (500 mg total) by mouth 3 (three) times daily. For 10 days     sulfamethoxazole-trimethoprim 800-160 MG per tablet  Commonly known as:  BACTRIM DS  Take 1 tablet by mouth 2 (two) times daily.       No Known Allergies     Follow-up Information   Follow up with [facility],MD, MD In 10 days. (Call our office [phone] to follow up after your hospital stay, To have your drain checked/studied  & possibly removed)    Specialty:  General Surgery      Follow up with [facility]. (home health nurse to check on drain management)    Contact information:   [phone]      Follow up with [facility], MD In 1 week.   Specialty:  Internal Medicine   Contact information:   [address]  [place] [address] [phone]        The results of significant diagnostics from this hospitalization (including imaging, microbiology, ancillary and laboratory) are listed below for reference.    Significant Diagnostic Studies: Ct Image Guided Drainage By Percutaneous Catheter  04/28/2013   CLINICAL DATA:  Recurrent diverticular abscess.  EXAM: CT GUIDED DRAINAGE OF PELVIC ABSCESS  ANESTHESIA/SEDATION: Intravenous Fentanyl and Versed were administered as conscious sedation during continuous cardiorespiratory monitoring by the radiology RN, with a total moderate sedation time of 10 minutes.  PROCEDURE: The procedure, risks, benefits, and alternatives were explained to the patient. Questions regarding the procedure were encouraged and answered. The patient understands and consents to the procedure.  Select axial scans through the lower pelvis were obtained an  the dominant collection was localized. An appropriate skin entry site was determined.  The overlying skin was prepped with Betadinein a sterile fashion, and a sterile drape was applied covering the operative field. A sterile gown and sterile gloves were used for the procedure. Local anesthesia was provided with 1% Lidocaine.  Under CT fluoroscopic guidance, a 19 gauge percutaneous entry needle was advanced into the collection. Purulent fluid spontaneously returned through the needle hub. A guidewire advanced easily through the needle, its position confirmed on CT. The tract was dilated to facilitate placement of 12 French pigtail catheter, formed centrally within the collection. Catheter placement confirmed on CT. The catheter was secured externally with 0 Prolene suture and StatLock and placed to gravity bag. A total of approximately 20 mL purulent material were aspirated. A sample was sent for  routine Gram stain, culture and sensitivity. The patient tolerated the procedure well.  COMPLICATIONS: No immediate  FINDINGS: The loculated pelvic gas and fluid collection immediately cephalad to the urinary bladder was again identified. Percutaneous drain catheter placed under CT guidance.  IMPRESSION: 1. Technically successful CT-guided pelvic abscess drain catheter placement.   Electronically Signed   By: Oley Balmaniel  Hassell M.D.   On: 04/28/2013 14:08    Microbiology: Recent Results (from the past 240 hour(s))  CULTURE, ROUTINE-ABSCESS     Status: None   Collection Time    04/28/13  1:45 PM      Result Value Ref Range Status   Specimen Description ABSCESS LEFT ABDOMEN   Final   Special Requests Normal   Final   Gram Stain     Final   Value: ABUNDANT WBC PRESENT,BOTH PMN AND MONONUCLEAR     NO SQUAMOUS EPITHELIAL CELLS SEEN     MODERATE GRAM POSITIVE COCCI     IN PAIRS FEW GRAM POSITIVE RODS     FEW GRAM NEGATIVE RODS   Culture     Final   Value: MODERATE ESCHERICHIA COLI     Performed at [facility]   Report Status 05/01/2013 FINAL   Final   Organism ID, Bacteria ESCHERICHIA COLI   Final     Labs: Basic Metabolic Panel:  Recent Labs Lab 04/27/13 1845 04/29/13 0339 04/30/13 0609  NA 137 135* 138  K 4.3 4.1 4.4  CL 98 98 100  CO2 25 23 25   GLUCOSE 130* 119* 110*  BUN 13 10 12   CREATININE 1.21 1.19 1.25  CALCIUM 9.5 8.8 9.2   Liver Function Tests: No results found for this basename: AST, ALT, ALKPHOS, BILITOT, PROT, ALBUMIN,  in the last 168 hours No results found for this basename: LIPASE, AMYLASE,  in the last 168 hours No results found for this basename: AMMONIA,  in the last 168 hours CBC:  Recent Labs Lab 04/27/13 1845 04/29/13 0339 04/30/13 0609  WBC 9.1 11.4* 7.9  NEUTROABS 5.8  --   --   HGB 14.3 12.7* 13.5  HCT 41.2 38.8* 39.9  MCV 89.0 90.7 89.3  PLT 382 375 365   Cardiac Enzymes: No results found for this basename: CKTOTAL, CKMB, CKMBINDEX, TROPONINI,  in the last 168 hours BNP: BNP (last 3 results) No results found for this basename: PROBNP,  in the last 8760 hours CBG: No results found for this basename: GLUCAP,  in the last 168 hours     Signed:  Joseph ArtJessica U Leauna Sharber  [facility] 05/01/2013, 9:34 AM

## 2013-05-01 NOTE — Progress Notes (Signed)
Stigler, Bobby, [name] [place], [place] [address] Phone: [phone] FAX: [phone]    Bobby [name] [id number] 09-01-1960  CARE TEAM:  PCP: Bobby Blitz, Bobby  Outpatient Care Team: Patient Care Team: Bobby Blitz, Bobby as PCP - General (Internal Medicine)  Inpatient Treatment Team: Treatment Team: Attending Provider: Geradine Girt, DO; Registered Nurse: Bobby Riley, RN; Rounding Team: Bobby Ends, Bobby; Registered Nurse: Bobby Calamity, RN; Technician: Bobby [name], NT; Respiratory Therapist: Isaac [name], RRT; Consulting Physician: Bobby Edison Pace, Bobby; Registered Nurse: Bobby Gurney, RN; Technician: Bobby [name], NT; Technician: Bobby [name], NT   Subjective:  Feeling better Wife in room Tolerating solids well  Objective:  Vital signs:  Filed Vitals:   04/30/13 0547 04/30/13 1443 04/30/13 2142 05/01/13 0624  BP: 99/71 104/63 96/60 106/62  Pulse: 65 79 74 81  Temp: 97.5 F (36.4 C) 98 F (36.7 C) 98.1 F (36.7 C) 97.8 F (36.6 C)  TempSrc: Oral Oral Oral Oral  Resp: 20 20 17 18   Height:      Weight:      SpO2: 99% 99% 97% 96%    Last BM Date: 04/27/13  Intake/Output   Yesterday:  04/25 0701 - 04/26 0700 In: 1415 [P.O.:1200; IV Piggyback:200] Out: 1275 [Urine:1250; Drains:25] This shift:     Bowel function:  Flatus: y  BM: y  Drain: thin bloody - no stool  Physical Exam:  General: Pt awake/alert/oriented x4 in no acute distress Eyes: PERRL, normal EOM.  Sclera clear.  No icterus Neuro: CN II-XII intact w/o focal sensory/motor deficits. Lymph: No head/neck/groin lymphadenopathy Psych:  No delerium/psychosis/paranoia HENT: Normocephalic, Mucus membranes moist.  No thrush Neck: Supple, No tracheal deviation Chest: No chest wall pain w good excursion CV:  Pulses intact.  Regular rhythm MS: Normal AROM mjr joints.  No obvious deformity Abdomen: Soft.   Mod distended.  Min tender LLQ at drain site.  No evidence of peritonitis.  No incarcerated hernias. Ext:  SCDs BLE.  No mjr edema.  No cyanosis Skin: No petechiae / purpura   Problem List:   Active Problems:   Diverticulitis of large intestine with abscess without bleeding   Assessment  Bobby [name]  53 y.o. male       Improving  Plan:  -IV ABX.  D/c w drain & PO ABX (Bactrim/Flagyl given Ecoli resistance to Ampicillin & Cipro)  -bland solids as tolerated -bowel regimen later after over diverticulitis -drain care =- teach ?need for HH? -VTE prophylaxis- SCDs, etc -mobilize as tolerated to help recovery  D/c w drain & PO ABX (Bactrim/Flagyl given Ecoli resistance to Ampicillin & Cipro) & close f/u in office to see if needs repaeat CT scan/drain study. With complex attack & repated attack, pt would benefit from colectomy.  Try to switch to elective situation in ~6 weeks to minimize chance of temporary ostomy.  Also allow preop colonoscopy to r/o malignancy.  I updated the patient's status to the pt & wife.  Recommendations were made.  Questions were answered.  They expressed understanding & appreciation.  D/C patient from hospital when patient meets criteria (anticipate today):  Tolerating oral intake well Ambulating in walkways Adequate pain control without IV medications Urinating  Having flatus Drain care taught, possible HH set up    Bobby [name], M.D., F.A.C.S. Gastrointestinal and Minimally Invasive Surgery [facility] [address] [place],  [place] [address] [phone] Main / Paging   05/01/2013   Results:   Labs: Results for orders placed during the hospital encounter of 04/27/13 (from the past 48 hour(s))  CBC     Status: None   Collection Time    04/30/13  6:09 AM      Result Value Ref Range   WBC 7.9  4.0 - 10.5 K/uL   RBC 4.47  4.22 - 5.81 MIL/uL   Hemoglobin 13.5  13.0 - 17.0 g/dL   HCT 39.9  39.0 -  52.0 %   MCV 89.3  78.0 - 100.0 fL   MCH 30.2  26.0 - 34.0 pg   MCHC 33.8  30.0 - 36.0 g/dL   RDW 13.2  11.5 - 15.5 %   Platelets 365  150 - 400 K/uL  BASIC METABOLIC PANEL     Status: Abnormal   Collection Time    04/30/13  6:09 AM      Result Value Ref Range   Sodium 138  137 - 147 mEq/L   Potassium 4.4  3.7 - 5.3 mEq/L   Chloride 100  96 - 112 mEq/L   CO2 25  19 - 32 mEq/L   Glucose, Bld 110 (*) 70 - 99 mg/dL   BUN 12  6 - 23 mg/dL   Creatinine, Ser 1.25  0.50 - 1.35 mg/dL   Calcium 9.2  8.4 - 10.5 mg/dL   GFR calc non Af Amer 64 (*) >90 mL/min   GFR calc Af Amer 74 (*) >90 mL/min   Comment: (NOTE)     The eGFR has been calculated using the CKD EPI equation.     This calculation has not been validated in all clinical situations.     eGFR's persistently <90 mL/min signify possible Chronic Kidney     Disease.    Imaging / Studies: No results found.  Medications / Allergies: per chart  Antibiotics: Anti-infectives   Start     Dose/Rate Route Frequency Ordered Stop   04/29/13 1600  piperacillin-tazobactam (ZOSYN) IVPB 3.375 g     3.375 g 12.5 mL/hr over 240 Minutes Intravenous 3 times per day 04/29/13 1540     04/27/13 1900  Ampicillin-Sulbactam (UNASYN) 3 g in sodium chloride 0.9 % 100 mL IVPB  Status:  Discontinued     3 g 100 mL/hr over 60 Minutes Intravenous Every 6 hours 04/27/13 1822 04/29/13 1459       Note: This dictation was prepared with Dragon/digital dictation along with [employer]. Any transcriptional errors that result from this process are unintentional.

## 2013-05-02 ENCOUNTER — Telehealth (INDEPENDENT_AMBULATORY_CARE_PROVIDER_SITE_OTHER): Payer: Self-pay | Admitting: General Surgery

## 2013-05-02 ENCOUNTER — Other Ambulatory Visit (INDEPENDENT_AMBULATORY_CARE_PROVIDER_SITE_OTHER): Payer: Self-pay | Admitting: Surgery

## 2013-05-02 DIAGNOSIS — K5780 Diverticulitis of intestine, part unspecified, with perforation and abscess without bleeding: Secondary | ICD-10-CM

## 2013-05-02 NOTE — Telephone Encounter (Signed)
LMOM for patient to call back and [facility]. I need to set up an CT per Dr Magnus IvanBlackman and apt with Dr Magnus IvanBlackman next week

## 2013-05-03 ENCOUNTER — Encounter (INDEPENDENT_AMBULATORY_CARE_PROVIDER_SITE_OTHER): Payer: Self-pay | Admitting: Surgery

## 2013-05-04 ENCOUNTER — Ambulatory Visit
Admission: RE | Admit: 2013-05-04 | Discharge: 2013-05-04 | Disposition: A | Discharge status: Home / Self Care | Payer: BC Managed Care – PPO | Source: Ambulatory Visit | Attending: Surgery | Admitting: Surgery

## 2013-05-04 DIAGNOSIS — K5780 Diverticulitis of intestine, part unspecified, with perforation and abscess without bleeding: Secondary | ICD-10-CM

## 2013-05-04 MED ORDER — IOHEXOL 300 MG/ML  SOLN
100.0000 mL | Freq: Once | INTRAMUSCULAR | Status: AC | PRN
  Administered 2013-05-04: 100 mL via INTRAVENOUS

## 2013-05-05 ENCOUNTER — Telehealth (INDEPENDENT_AMBULATORY_CARE_PROVIDER_SITE_OTHER): Payer: Self-pay

## 2013-05-05 NOTE — Telephone Encounter (Signed)
Drain can come out.  He can come and have nurse only visit for it to be pulled, or have an Urg doc pull it, or wait to see me.  CT showed abscess has resolved

## 2013-05-05 NOTE — Telephone Encounter (Signed)
Pt calling for results of his CT scan yesterday.  He wants to know if his drain is ready to come out.  He has appt next week with Dr. Magnus IvanBlackman.  Please advise.

## 2013-05-06 ENCOUNTER — Ambulatory Visit (INDEPENDENT_AMBULATORY_CARE_PROVIDER_SITE_OTHER): Payer: BC Managed Care – PPO | Admitting: General Surgery

## 2013-05-06 ENCOUNTER — Encounter (INDEPENDENT_AMBULATORY_CARE_PROVIDER_SITE_OTHER): Payer: Self-pay | Admitting: General Surgery

## 2013-05-06 NOTE — Patient Instructions (Signed)
Patient came in for nurse only visit to have drain removed and the amount was less then 1 cc per wife for the last pass couple days. I removed the drain and placed antibodies ornament over the opening and placed a band aid over the site and told the patient that he can take an shower and get out and place a band aid over it until it closes up

## 2013-05-10 ENCOUNTER — Telehealth (INDEPENDENT_AMBULATORY_CARE_PROVIDER_SITE_OTHER): Payer: Self-pay

## 2013-05-10 NOTE — Telephone Encounter (Signed)
Patient states he is having hiccups off/on since drain was removed. Advised him this may be side effect from anesthesia. But I will inform DR. Gross and we will call him.

## 2013-05-13 ENCOUNTER — Encounter (INDEPENDENT_AMBULATORY_CARE_PROVIDER_SITE_OTHER): Payer: Self-pay | Admitting: Surgery

## 2013-05-13 ENCOUNTER — Encounter: Payer: Self-pay | Admitting: Internal Medicine

## 2013-05-13 ENCOUNTER — Other Ambulatory Visit (INDEPENDENT_AMBULATORY_CARE_PROVIDER_SITE_OTHER): Payer: Self-pay | Admitting: Surgery

## 2013-05-13 ENCOUNTER — Ambulatory Visit (INDEPENDENT_AMBULATORY_CARE_PROVIDER_SITE_OTHER): Payer: BC Managed Care – PPO | Admitting: Surgery

## 2013-05-13 VITALS — BP 122/82 | HR 74 | Temp 97.2°F | Ht 66.0 in | Wt 174.0 lb

## 2013-05-13 DIAGNOSIS — K5732 Diverticulitis of large intestine without perforation or abscess without bleeding: Secondary | ICD-10-CM

## 2013-05-13 DIAGNOSIS — K630 Abscess of intestine: Secondary | ICD-10-CM

## 2013-05-13 DIAGNOSIS — K5792 Diverticulitis of intestine, part unspecified, without perforation or abscess without bleeding: Secondary | ICD-10-CM

## 2013-05-13 DIAGNOSIS — K5780 Diverticulitis of intestine, part unspecified, with perforation and abscess without bleeding: Secondary | ICD-10-CM

## 2013-05-13 MED ORDER — HYDROCODONE-ACETAMINOPHEN 5-325 MG PO TABS: 1.0000 | ORAL_TABLET | Freq: Four times a day (QID) | ORAL | Status: DC | PRN

## 2013-05-13 MED ORDER — METRONIDAZOLE 500 MG PO TABS: 500.0000 mg | ORAL_TABLET | Freq: Three times a day (TID) | ORAL | Status: DC

## 2013-05-13 MED ORDER — SULFAMETHOXAZOLE-TMP DS 800-160 MG PO TABS: 1.0000 | ORAL_TABLET | Freq: Two times a day (BID) | ORAL | Status: DC

## 2013-05-13 NOTE — Progress Notes (Signed)
Subjective:     Patient ID: Bobby [name], male   DOB: 30-Apr-1960, 53 y.o.   MRN: [id number]  HPI He is here for hospital followup status post admission for diverticulitis with abscess. He had a percutaneous drain placed at that time. He has since had a followup CAT scan which showed resolution of the abscess and the drain is then removed. He is finishing his antibiotics. He still reports having some sweats and occasional discomfort.  Review of Systems     Objective:   Physical Exam On exam, he is well appearance. He is afebrile. His abdomen is soft and nontender    Assessment:     Diverticulitis with abscess     Plan:     This is his second occurrence of diverticulitis with an abscess that needed to be percutaneously drained. I believe he needs an elective partial colectomy. I discussed this with the patient and his wife in detail. I believe he does need a preop colonoscopy which we will arrange. I am going to continue him on antibiotics and see him back in approximately 2 weeks.

## 2013-05-31 ENCOUNTER — Ambulatory Visit (INDEPENDENT_AMBULATORY_CARE_PROVIDER_SITE_OTHER): Payer: BC Managed Care – PPO | Admitting: Surgery

## 2013-05-31 ENCOUNTER — Encounter (INDEPENDENT_AMBULATORY_CARE_PROVIDER_SITE_OTHER): Payer: Self-pay | Admitting: Surgery

## 2013-05-31 VITALS — BP 130/75 | HR 70 | Temp 97.2°F | Resp 18 | Ht 66.0 in | Wt 178.2 lb

## 2013-05-31 DIAGNOSIS — K5780 Diverticulitis of intestine, part unspecified, with perforation and abscess without bleeding: Secondary | ICD-10-CM

## 2013-05-31 DIAGNOSIS — K5732 Diverticulitis of large intestine without perforation or abscess without bleeding: Secondary | ICD-10-CM

## 2013-05-31 DIAGNOSIS — K630 Abscess of intestine: Secondary | ICD-10-CM

## 2013-05-31 NOTE — Progress Notes (Signed)
Subjective:     Patient ID: Bobby [name], male   DOB: 12/22/1960, 53 y.o.   MRN: [id number]  HPI He is here for another followup visit. He has been off antibiotics for several days. He has no abdominal pain whatsoever.  Review of Systems     Objective:   Physical Exam On exam, his abdomen is soft and nontender    Assessment:     Recurrent diverticulitis of abscess     Plan:     He will be seeing Dr. Marina Goodell on July 2 for evaluation for colonoscopy preoperatively. He'll let me know and that it can be so we can see we can work this out with the timing of his eventual laparoscopic partial colectomy

## 2013-07-11 ENCOUNTER — Ambulatory Visit: Payer: BC Managed Care – PPO | Admitting: Internal Medicine

## 2015-01-30 IMAGING — CT CT IMAGE GUIDED DRAINAGE BY PERCUTANEOUS CATHETER
1 of 2 series · 14 of 32 positions shown, 19 images · non-contrast
Comparison: none

CLINICAL DATA: Recurrent diverticular abscess.

[Series 2: i-spiral 5.0 b40f · axial · 0.78mm/px · z∈[-219,-114]mm · 14 of 34 slices shown, 19 images]
[im 2/34  soft-tissue]
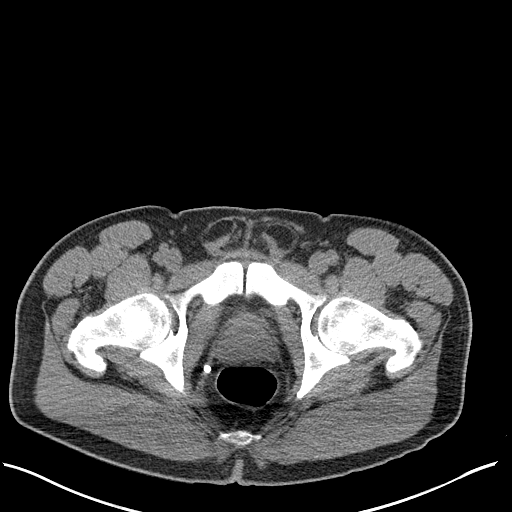
[im 2/34  bone]
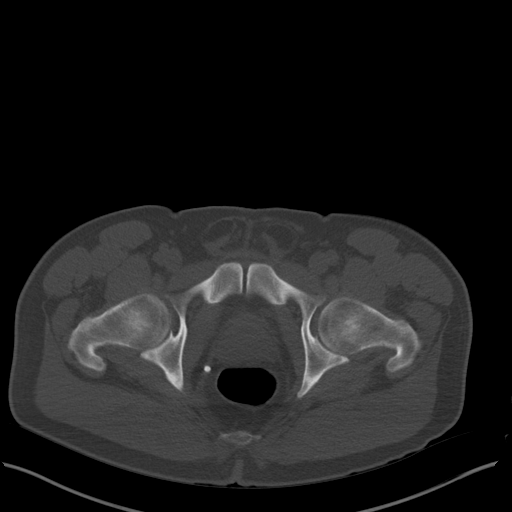
[im 6/34  soft-tissue]
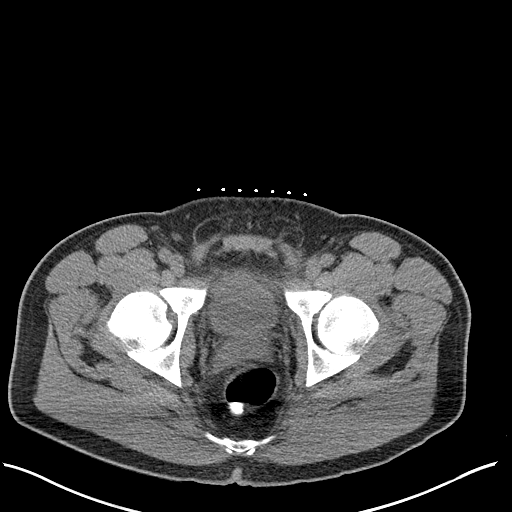
[im 7/34  soft-tissue]
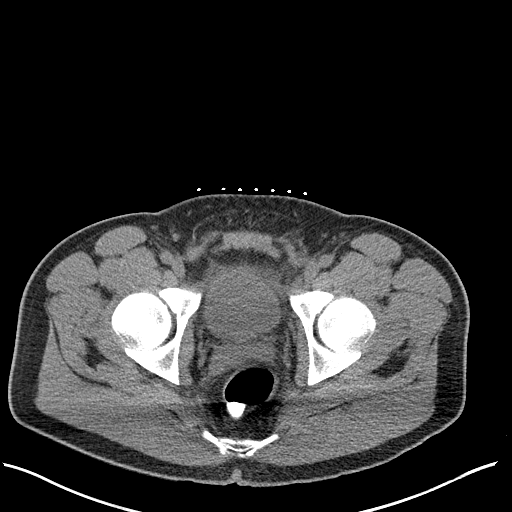
[im 9/34  soft-tissue]
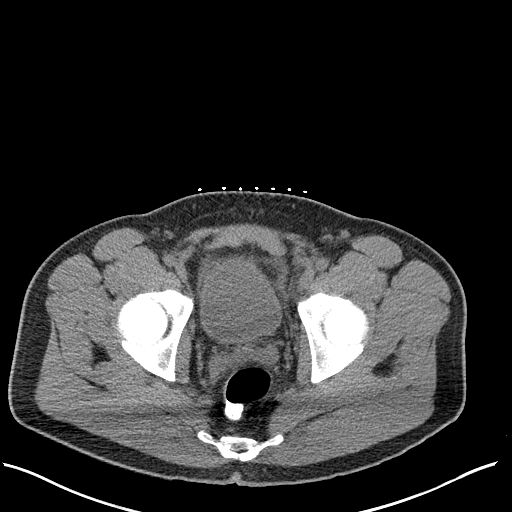
[im 13/34  soft-tissue]
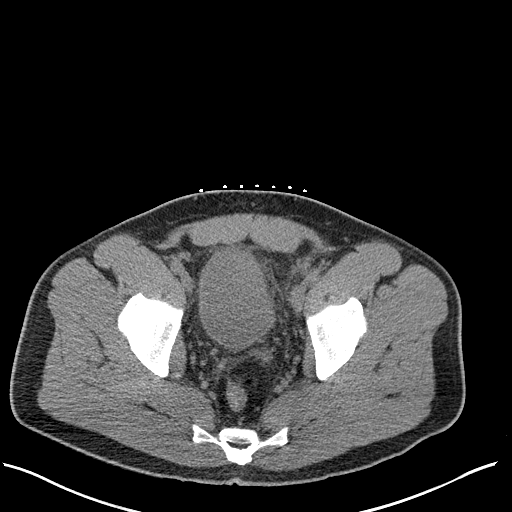
[im 14/34  soft-tissue]
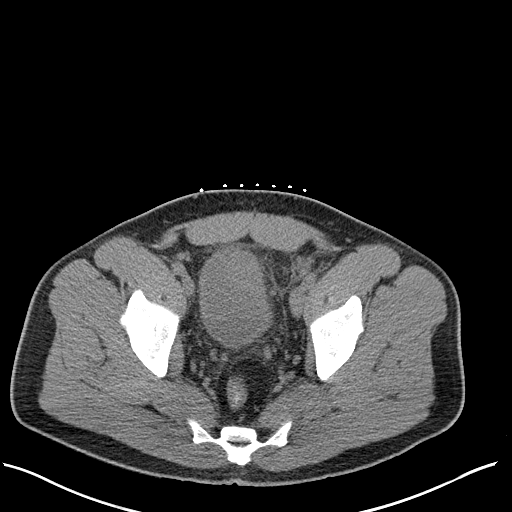
[im 18/34  soft-tissue]
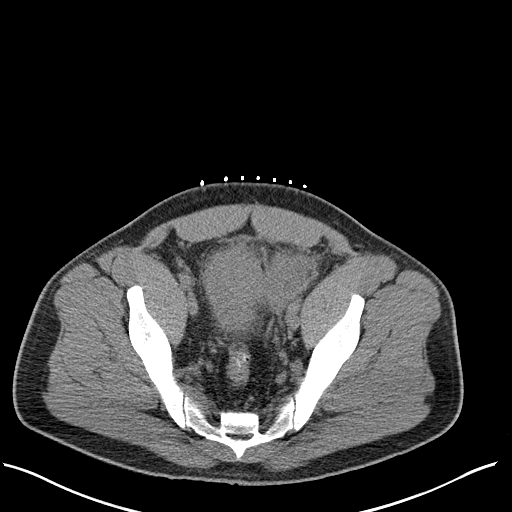
[im 20/34  soft-tissue]
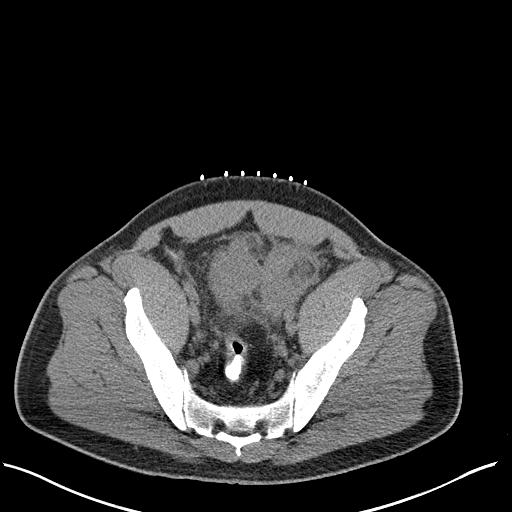
[im 21/34  soft-tissue]
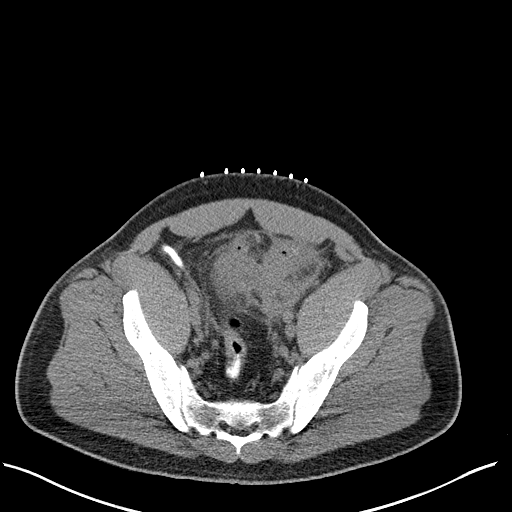
[im 21/34  bone]
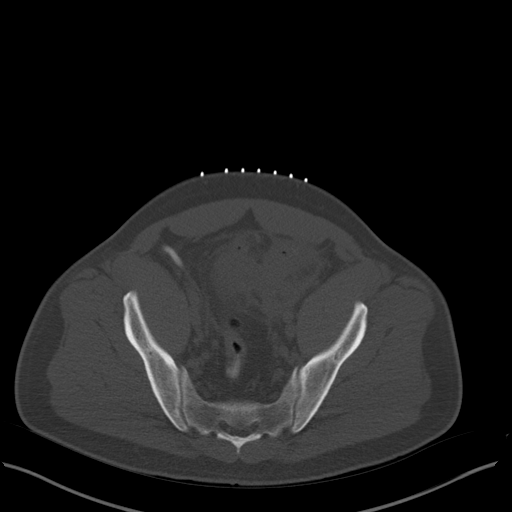
[im 25/34  soft-tissue]
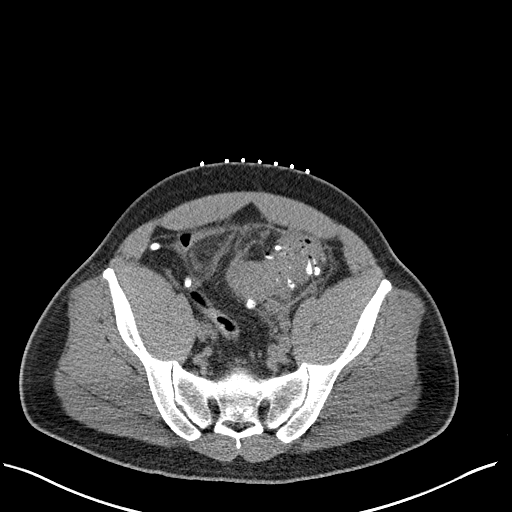
[im 27/34  soft-tissue]
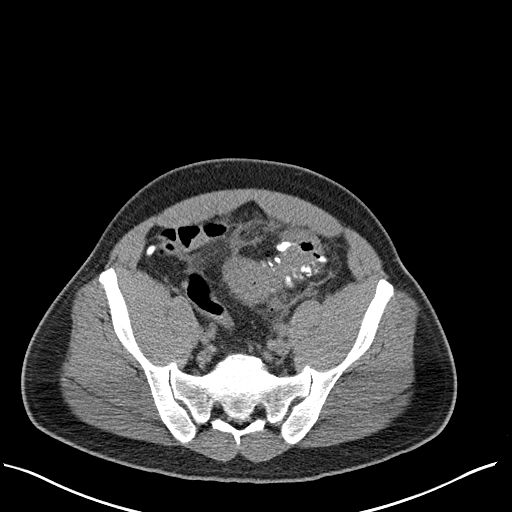
[im 27/34  lung]
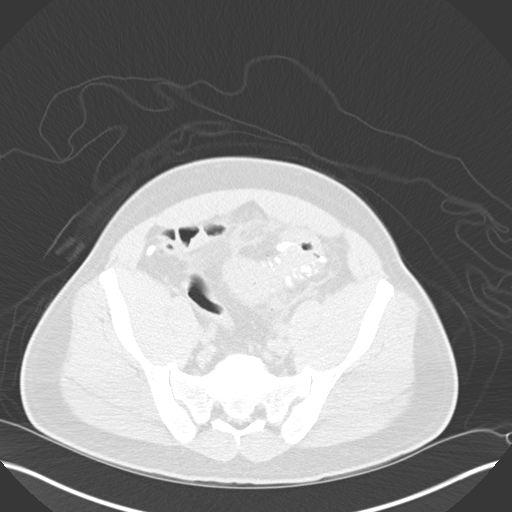
[im 28/34  soft-tissue]
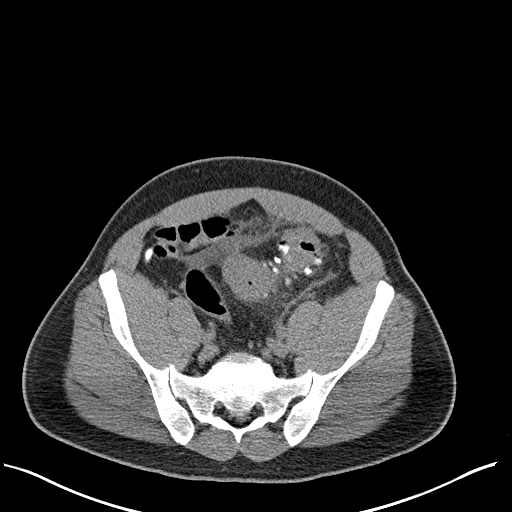
[im 28/34  lung]
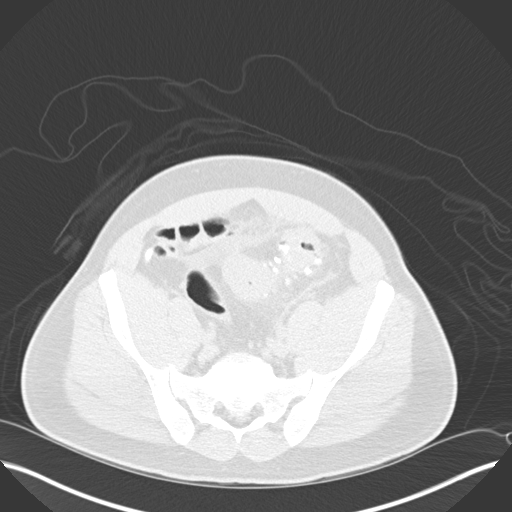
[im 30/34  lung]
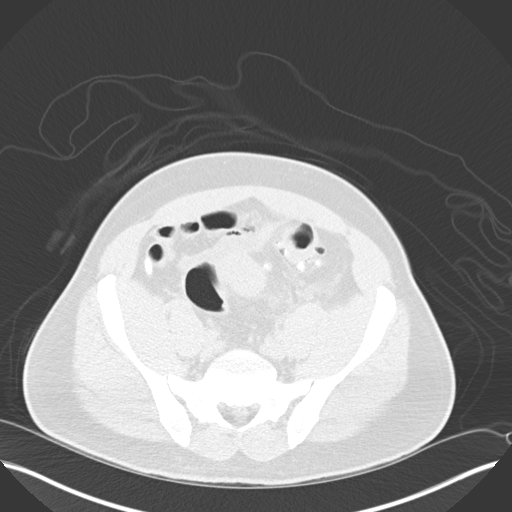
[im 32/34  soft-tissue]
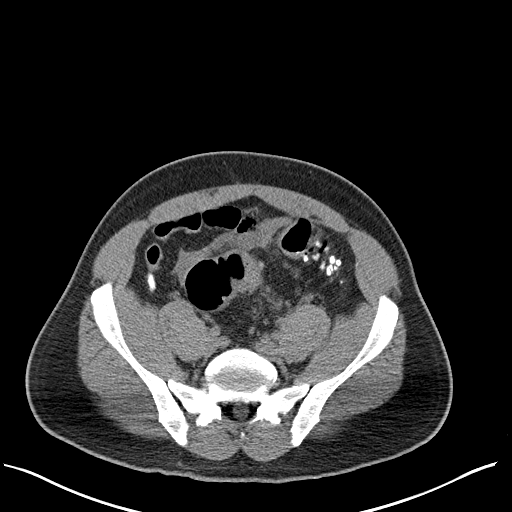
[im 32/34  lung]
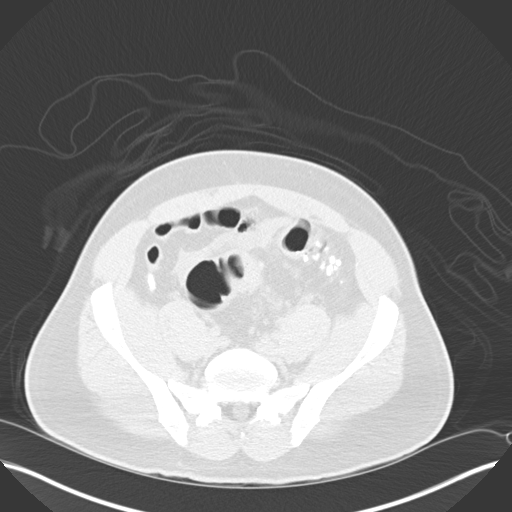

[14 of 32 positions shown; findings below may reference images not displayed]

EXAM:
CT GUIDED DRAINAGE OF PELVIC ABSCESS

ANESTHESIA/SEDATION:
Intravenous Fentanyl and Versed were administered as conscious
sedation during continuous cardiorespiratory monitoring by the
radiology RN, with a total moderate sedation time of 10 minutes.

PROCEDURE:
The procedure, risks, benefits, and alternatives were explained to
the patient. Questions regarding the procedure were encouraged and
answered. The patient understands and consents to the procedure.

Select axial scans through the lower pelvis were obtained an the
dominant collection was localized. An appropriate skin entry site
was determined.

The overlying skin was prepped with Betadinein a sterile fashion,
and a sterile drape was applied covering the operative field. A
sterile gown and sterile gloves were used for the procedure. Local
anesthesia was provided with 1% Lidocaine.

Under CT fluoroscopic guidance, a 19 gauge percutaneous entry needle
was advanced into the collection. Purulent fluid spontaneously
returned through the needle hub. A guidewire advanced easily through
the needle, its position confirmed on CT. The tract was dilated to
facilitate placement of 12 French pigtail catheter, formed centrally
within the collection. Catheter placement confirmed on CT. The
catheter was secured externally with 0 Prolene suture and StatLock
and placed to gravity bag. A total of approximately 20 mL purulent
material were aspirated. A sample was sent for routine Gram stain,
culture and sensitivity. The patient tolerated the procedure well.

COMPLICATIONS:
No immediate
FINDINGS: The loculated pelvic gas and fluid collection immediately cephalad
to the urinary bladder was again identified. Percutaneous drain
catheter placed under CT guidance.
IMPRESSION: 1. Technically successful CT-guided pelvic abscess drain catheter
placement.

## 2015-02-05 IMAGING — CT CT ABD-PELV W/ CM
2 of 5 series · 16 of 46 positions shown, 18 images · IV contrast (READICAT/WATER & [ID] OMNI 300)
Comparison: 04/28/2011

CLINICAL DATA: Diverticulitis with pelvic abscess, status post
percutaneous drainage 04/28/2013

EXAM:
CT ABDOMEN AND PELVIS WITH CONTRAST
TECHNIQUE: Multidetector CT imaging of the abdomen and pelvis was performed
using the standard protocol following bolus administration of
intravenous contrast.
CONTRAST:  100mL OMNIPAQUE IOHEXOL 300 MG/ML  SOLN

[Series 2: abd/pelvis with · axial · 0.70mm/px · z∈[-410,-10]mm · 13 of 90 slices shown, 15 images]
[im 5/90  soft-tissue]
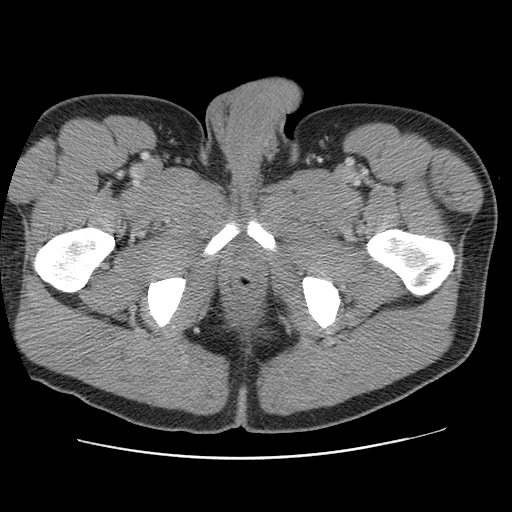
[im 5/90  bone]
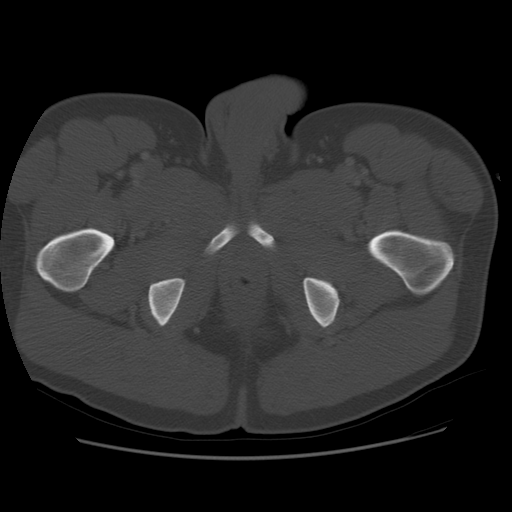
[im 10/90  soft-tissue]
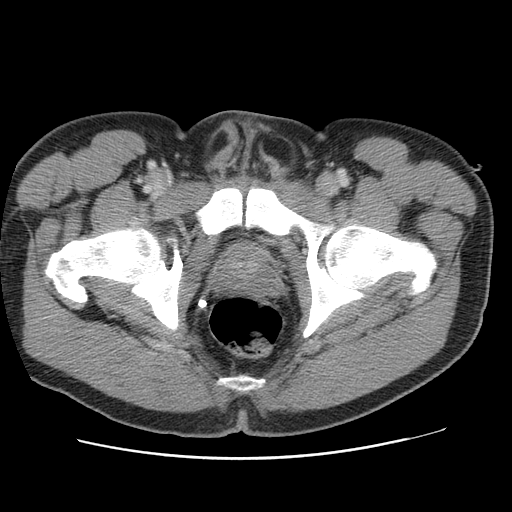
[im 20/90  soft-tissue]
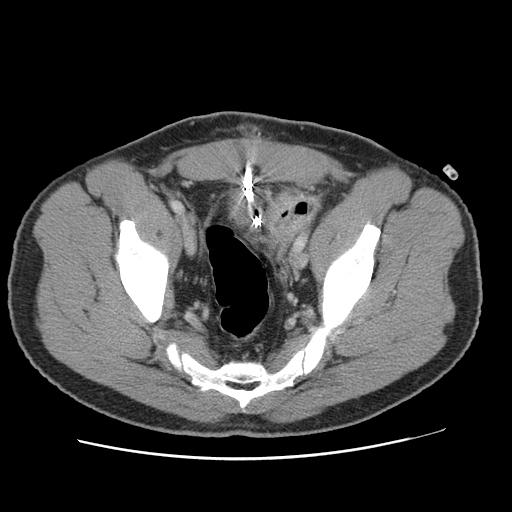
[im 25/90  soft-tissue]
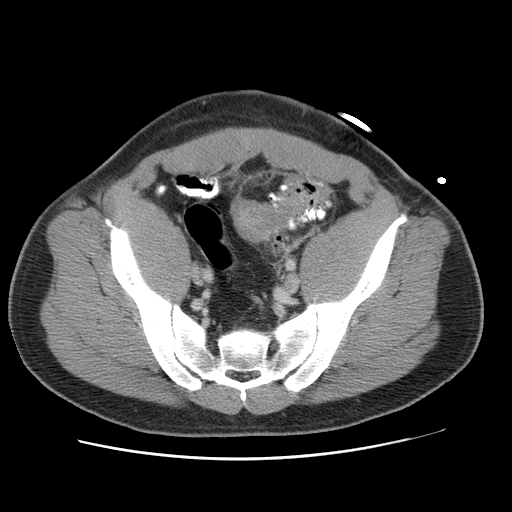
[im 30/90  soft-tissue]
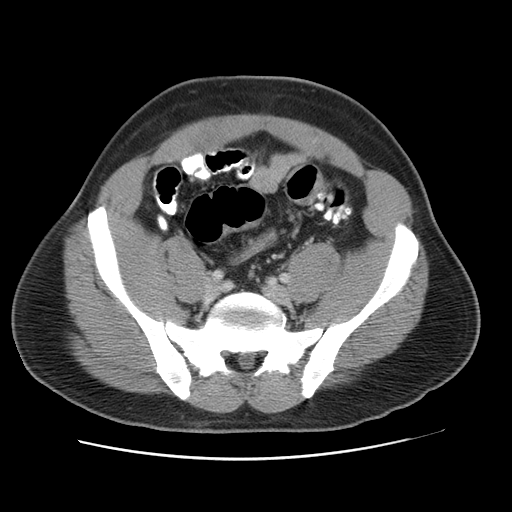
[im 40/90  soft-tissue]
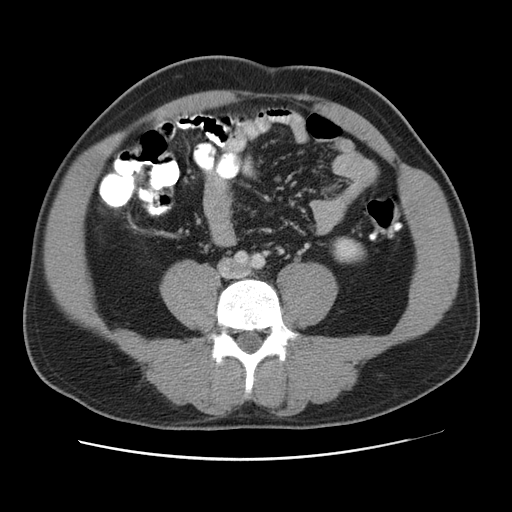
[im 45/90  soft-tissue]
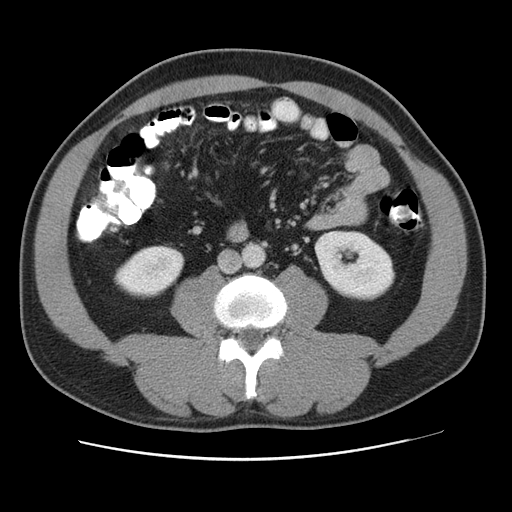
[im 50/90  soft-tissue]
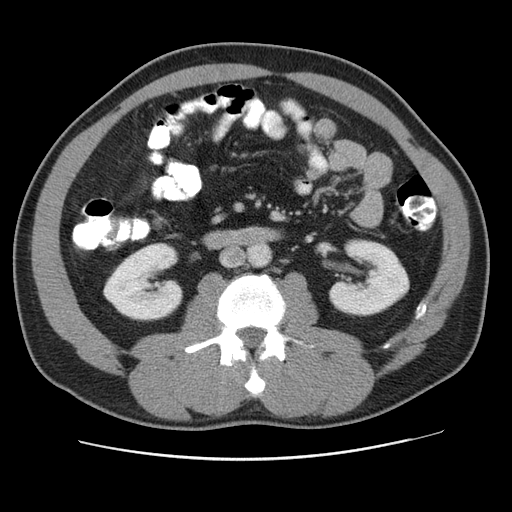
[im 60/90  soft-tissue]
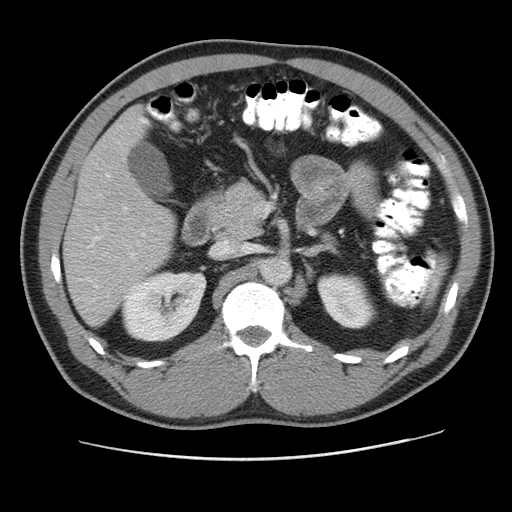
[im 60/90  bone]
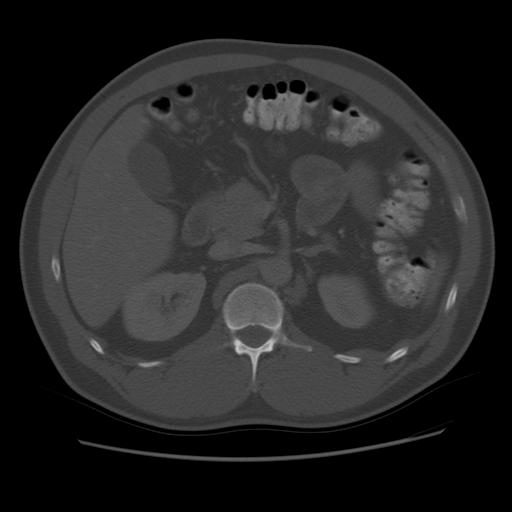
[im 65/90  soft-tissue]
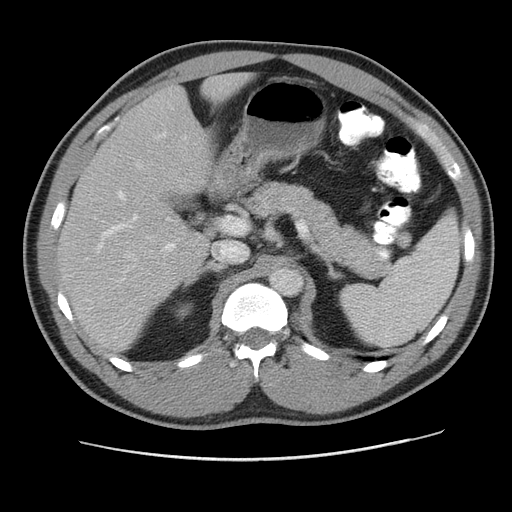
[im 70/90  soft-tissue]
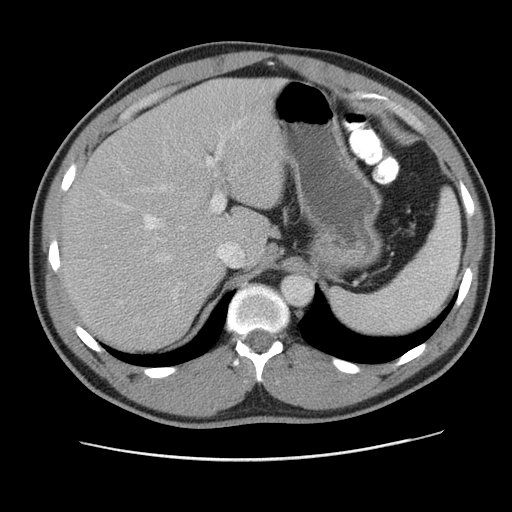
[im 80/90  soft-tissue]
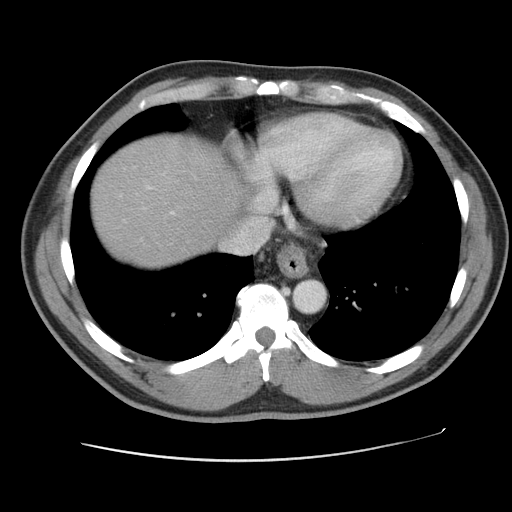
[im 85/90  soft-tissue]
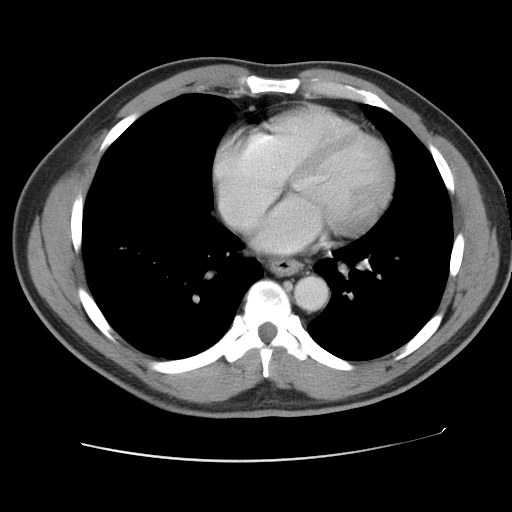

[Series 400: cor · coronal · 0.98mm/px · 3 of 146 slices shown]
[im 49/146  soft-tissue]
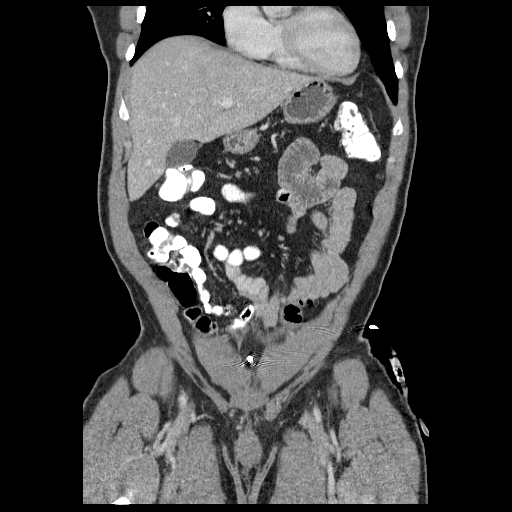
[im 65/146  soft-tissue]
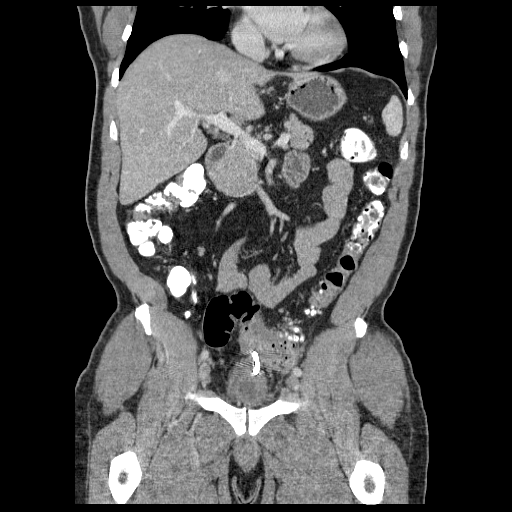
[im 81/146  soft-tissue]
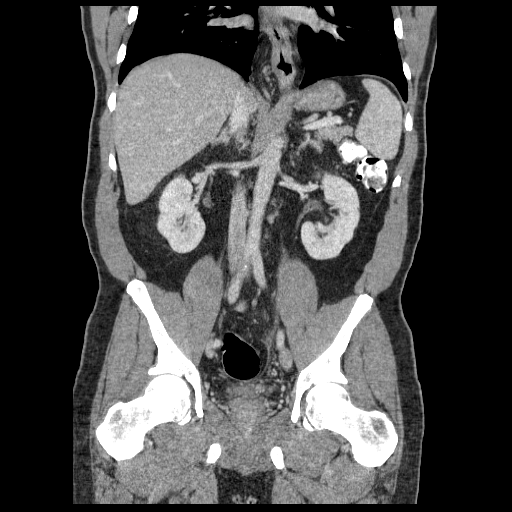

[16 of 46 positions shown; findings below may reference images not displayed]

FINDINGS: Clear lung bases. Normal heart size. No pericardial or pleural
effusion. Small hiatal hernia noted.

Abdomen: Liver, gallbladder, biliary system, pancreas, spleen,
accessory splenule, adrenal glands, and kidneys are within normal
limits for age and demonstrate no acute process.

Negative for abdominal free fluid, fluid collection, hemorrhage,
abscess, adenopathy, or aneurysm.

No bowel obstruction, dilatation, and ileus, or free air.

Normal appendix.

Pelvis: Interval insertion of a midline anterior pelvic drain into
the diverticular abscess which overlies the bladder dome. This small
abscess has resolved. There is interval improvement in the adjacent
sigmoid diverticulitis with less inflammatory changes. The lateral
second small diverticular abscess is smaller now measuring 2.8 x
cm, previously 4.1 x 1.9 cm. Small amount of extraluminal air noted
within the pericolonic fat. Findings are suspicious for residual
fistula to sigmoid colon. This could be confirmed with an abscess
drain catheter injection.

No dependent pelvic free fluid, adenopathy, hemorrhage, inguinal
abnormality, or hernia.

Minor degenerative changes of the spine.  No acute osseous finding.
IMPRESSION: Interval improvement in the left lower quadrant sigmoid
diverticulitis.

Midline medial pelvic diverticular abscess has been successfully
drained by the percutaneous catheter.

Adjacent lateral sigmoid diverticular abscess is smaller,
measurements as above.

Surrounding pericolonic locules of extraluminal gas suspicious for a
residual fistula to the sigmoid colon. Consider contrast injection
of the existing drain catheter for further evaluation.
